# Patient Record
Sex: Male | Born: 1965
Health system: Southern US, Community
[De-identification: ages and names within clinical notes are randomized; demographics above are authoritative.]

## PROBLEM LIST (undated history)

## (undated) DIAGNOSIS — E785 Hyperlipidemia, unspecified: Secondary | ICD-10-CM

## (undated) HISTORY — DX: Hyperlipidemia, unspecified: E78.5

---

## 1991-01-11 HISTORY — PX: APPENDECTOMY: SHX54

## 2006-02-17 ENCOUNTER — Ambulatory Visit: Payer: Self-pay | Admitting: Family Medicine

## 2006-03-03 ENCOUNTER — Ambulatory Visit: Payer: Self-pay | Admitting: Family Medicine

## 2007-12-14 ENCOUNTER — Ambulatory Visit: Payer: Self-pay | Admitting: Family Medicine

## 2008-03-04 ENCOUNTER — Ambulatory Visit: Payer: Self-pay | Admitting: Family Medicine

## 2008-05-02 ENCOUNTER — Ambulatory Visit: Payer: Self-pay | Admitting: Family Medicine

## 2009-02-27 ENCOUNTER — Ambulatory Visit: Payer: Self-pay | Admitting: Family Medicine

## 2009-05-20 ENCOUNTER — Ambulatory Visit: Payer: Self-pay | Admitting: Family Medicine

## 2009-06-11 ENCOUNTER — Ambulatory Visit: Payer: Self-pay | Admitting: Physician Assistant

## 2009-07-08 ENCOUNTER — Ambulatory Visit: Payer: Self-pay | Admitting: Family Medicine

## 2010-02-05 ENCOUNTER — Ambulatory Visit: Admit: 2010-02-05 | Payer: Self-pay | Admitting: Family Medicine

## 2010-10-05 ENCOUNTER — Encounter: Payer: Self-pay | Admitting: Family Medicine

## 2010-10-08 ENCOUNTER — Encounter: Payer: Self-pay | Admitting: Family Medicine

## 2010-10-08 ENCOUNTER — Ambulatory Visit (INDEPENDENT_AMBULATORY_CARE_PROVIDER_SITE_OTHER): Payer: No Typology Code available for payment source | Admitting: Family Medicine

## 2010-10-08 VITALS — BP 122/80 | HR 60 | Ht 71.0 in | Wt 235.0 lb

## 2010-10-08 DIAGNOSIS — I499 Cardiac arrhythmia, unspecified: Secondary | ICD-10-CM

## 2010-10-08 DIAGNOSIS — Z0000 Encounter for general adult medical examination without abnormal findings: Secondary | ICD-10-CM

## 2010-10-08 DIAGNOSIS — E785 Hyperlipidemia, unspecified: Secondary | ICD-10-CM | POA: Insufficient documentation

## 2010-10-08 DIAGNOSIS — E669 Obesity, unspecified: Secondary | ICD-10-CM

## 2010-10-08 LAB — CBC WITH DIFFERENTIAL/PLATELET
Basophils Absolute: 0 10*3/uL (ref 0.0–0.1)
Basophils Relative: 0 % (ref 0–1)
Eosinophils Absolute: 0.1 10*3/uL (ref 0.0–0.7)
Eosinophils Relative: 4 % (ref 0–5)
HCT: 49.5 % — ABNORMAL HIGH (ref 36.0–46.0)
Hemoglobin: 17 g/dL — ABNORMAL HIGH (ref 12.0–15.0)
Lymphocytes Relative: 52 % — ABNORMAL HIGH (ref 12–46)
Lymphs Abs: 1.7 10*3/uL (ref 0.7–4.0)
MCH: 31.1 pg (ref 26.0–34.0)
MCHC: 34.3 g/dL (ref 30.0–36.0)
MCV: 90.7 fL (ref 78.0–100.0)
Monocytes Absolute: 0.3 10*3/uL (ref 0.1–1.0)
Monocytes Relative: 10 % (ref 3–12)
Neutro Abs: 1.1 10*3/uL — ABNORMAL LOW (ref 1.7–7.7)
Neutrophils Relative %: 34 % — ABNORMAL LOW (ref 43–77)
Platelets: 165 10*3/uL (ref 150–400)
RBC: 5.46 MIL/uL — ABNORMAL HIGH (ref 3.87–5.11)
RDW: 14.3 % (ref 11.5–15.5)
WBC: 3.3 10*3/uL — ABNORMAL LOW (ref 4.0–10.5)

## 2010-10-08 LAB — POCT URINALYSIS DIPSTICK
Bilirubin, UA: NEGATIVE
Blood, UA: NEGATIVE
Clarity, UA: NEGATIVE
Glucose, UA: NEGATIVE
Ketones, UA: NEGATIVE
Leukocytes, UA: NEGATIVE
Nitrite, UA: NEGATIVE
Protein, UA: NEGATIVE
Spec Grav, UA: 1.02
Urobilinogen, UA: NEGATIVE
pH, UA: 5

## 2010-10-08 NOTE — Progress Notes (Signed)
  Subjective:    Patient ID: Billy Tate, male    DOB: [birth year], 45 y.o.   MRN: [id number]  HPI He is here for a complete examination. He does have intermittent aches and pains especially in his knees. He also said intermittent low back pains. He has a history dating back to his teenage years of having an irregular rhythm. He was able to play in sports and apparently was cleared during that timeframe. He states he did have some procedures done but is not sure what. Review his record indicates previous documentation of unifocal PVCs. There is a family history of 2 relatives on his mother with a defib and pacer unit and apparently his brother also has a pacemaker.   Review of Systems  Constitutional: Negative.   HENT: Negative.   Eyes: Negative.   Respiratory: Negative.   Cardiovascular: Negative.   Gastrointestinal: Negative.   Genitourinary: Negative.   Musculoskeletal: Positive for back pain and arthralgias.  Neurological: Negative.        Objective:   Physical Exam BP 122/80  Pulse 60  Ht 5\' 11"  (1.803 m)  Wt 235 lb (106.595 kg)  BMI 32.78 kg/m2  General Appearance:    Alert, cooperative, no distress, appears stated age  Head:    Normocephalic, without obvious abnormality, atraumatic  Eyes:    PERRL, conjunctiva/corneas clear, EOM's intact, fundi    benign  Ears:    Normal TM's and external ear canals  Nose:   Nares normal, mucosa normal, no drainage or sinus   tenderness  Throat:   Lips, mucosa, and tongue normal; teeth and gums normal  Neck:   Supple, no lymphadenopathy;  thyroid:  no   enlargement/tenderness/nodules; no carotid   bruit or JVD  Back:    Spine nontender, no curvature, ROM normal, no CVA     tenderness  Lungs:     Clear to auscultation bilaterally without wheezes, rales or     ronchi; respirations unlabored  Chest Wall:    No tenderness or deformity   Heart:    Regular rate and rhythm, S1 and S2 normal, no murmur, rub   or gallop  Breast Exam:    No  chest wall tenderness, masses or gynecomastia  Abdomen:     Soft, non-tender, nondistended, normoactive bowel sounds,    no masses, no hepatosplenomegaly  Genitalia:    Normal male external genitalia without lesions.  Testicles without masses.  No inguinal hernias.  Rectal:    Normal sphincter tone, no masses or tenderness; guaiac negative stool.  Prostate smooth, no nodules, not enlarged.  Extremities:   No clubbing, cyanosis or edema  Pulses:   2+ and symmetric all extremities  Skin:   Skin color, texture, turgor normal, no rashes or lesions  Lymph nodes:   Cervical, supraclavicular, and axillary nodes normal  Neurologic:   CNII-XII intact, normal strength, sensation and gait; reflexes 2+ and symmetric throughout          Psych:   Normal mood, affect, hygiene and grooming.    EKG shows frequent unifocal PVCs       Assessment & Plan:   1. Physical exam, annual  POCT Urinalysis Dipstick, POCT Hemoccult (POC) Blood/Stool Test, CBC with Differential, Comprehensive metabolic panel, Lipid panel  2. Arrhythmia  PR ELECTROCARDIOGRAM, COMPLETE, CBC with Differential, Comprehensive metabolic panel, Lipid panel, Ambulatory referral to Cardiology  3. Hyperlipidemia LDL goal < 100  Lipid panel  4. Obesity (BMI 30-39.9)

## 2010-10-08 NOTE — Patient Instructions (Signed)
Keep working on [employer] and exercise. This should help all of your aches and pains.

## 2010-10-09 LAB — LIPID PANEL
Cholesterol: 235 mg/dL — ABNORMAL HIGH (ref 0–200)
HDL: 55 mg/dL (ref >39)
LDL Cholesterol: 166 mg/dL — ABNORMAL HIGH (ref 0–99)
Total CHOL/HDL Ratio: 4.3 Ratio
Triglycerides: 72 mg/dL (ref <150)
VLDL: 14 mg/dL (ref 0–40)

## 2010-10-09 LAB — COMPREHENSIVE METABOLIC PANEL
ALT: 14 U/L (ref 0–35)
AST: 17 U/L (ref 0–37)
Albumin: 4 g/dL (ref 3.5–5.2)
Alkaline Phosphatase: 63 U/L (ref 39–117)
BUN: 16 mg/dL (ref 6–23)
CO2: 27 mEq/L (ref 19–32)
Calcium: 9.5 mg/dL (ref 8.4–10.5)
Chloride: 104 mEq/L (ref 96–112)
Creat: 1.16 mg/dL — ABNORMAL HIGH (ref 0.50–1.10)
Glucose, Bld: 79 mg/dL (ref 70–99)
Potassium: 4.1 mEq/L (ref 3.5–5.3)
Sodium: 139 mEq/L (ref 135–145)
Total Bilirubin: 0.7 mg/dL (ref 0.3–1.2)
Total Protein: 7.8 g/dL (ref 6.0–8.3)

## 2011-04-22 ENCOUNTER — Encounter: Payer: Self-pay | Admitting: Medical

## 2011-04-22 ENCOUNTER — Ambulatory Visit (INDEPENDENT_AMBULATORY_CARE_PROVIDER_SITE_OTHER): Payer: No Typology Code available for payment source | Admitting: Medical

## 2011-04-22 VITALS — BP 122/80 | HR 60 | Temp 97.9°F | Resp 16 | Wt 234.0 lb

## 2011-04-22 DIAGNOSIS — R040 Epistaxis: Secondary | ICD-10-CM

## 2011-04-22 DIAGNOSIS — J069 Acute upper respiratory infection, unspecified: Secondary | ICD-10-CM

## 2011-04-22 MED ORDER — BENZONATATE 200 MG PO CAPS: 200.0000 mg | ORAL_CAPSULE | Freq: Two times a day (BID) | ORAL | Status: AC | PRN

## 2011-04-22 NOTE — Progress Notes (Signed)
Subjective:  Billy Tate is a 46 y.o. male who presents for cold symptoms and nosebleed.   He reports few day hx/o cold symptoms including scratchy throat, nasal congestion, sneezing, dry cough that is keeping him up at night interfering with sleep last nigh, and phlegm in the throat.  The main reason he came in is that when he sneezed this morning he had a nosebleed. He was able to stop the bleed in .  He had dried blood in the nose yesterday.  Using some Advil Cold and Sinus.  Denies sick contacts.  No other aggravating or relieving factors.  No other c/o.  Past Medical History  Diagnosis Date  . Dyslipidemia   . Hemorrhoids    ROS as noted in HPI   Objective:   Filed Vitals:   04/22/11 1555  BP: 122/80  Pulse: 60  Temp: 97.9 F (36.6 C)  Resp: 16    General appearance: Alert, WD/WN, no distress                             Skin: warm, no rash                           Head: no sinus tenderness                            Eyes: conjunctiva normal, corneas clear, PERRLA                            Ears: pearly TMs, external ear canals normal                          Nose: septum midline, turbinates swollen, with erythema and clear discharge             Mouth/throat: MMM, tongue normal, mild pharyngeal erythema                           Neck: supple, no adenopathy, no thyromegaly, nontender                          Heart: RRR, normal S1, S2, no murmurs                         Lungs: CTA bilaterally, no wheezes, rales, or rhonchi     Assessment and Plan:   Encounter Diagnoses  Name Primary?  . URI (upper respiratory infection) Yes  . Epistaxis    URI - Suggested symptomatic OTC remedies.  Nasal saline spray for congestion.  Tylenol or Ibuprofen OTC for fever and malaise.  Call/return in 2-3 days if symptoms aren't resolving.   Epistaxis - advised holding direct pressure of nose 58min+ with nosebleed, can use Vaseline in nares for moisture and prevention, avoid blowing nose  after initial bleed, and if he continues to have nosebleeds, or if nosebleeds >1hr without resolution, call/return, or go to the ED.

## 2012-12-21 ENCOUNTER — Ambulatory Visit (INDEPENDENT_AMBULATORY_CARE_PROVIDER_SITE_OTHER): Payer: PRIVATE HEALTH INSURANCE | Admitting: Family Medicine

## 2012-12-21 ENCOUNTER — Encounter: Payer: Self-pay | Admitting: Family Medicine

## 2012-12-21 VITALS — BP 120/82 | HR 89 | Ht 71.0 in | Wt 230.0 lb

## 2012-12-21 DIAGNOSIS — Z23 Encounter for immunization: Secondary | ICD-10-CM

## 2012-12-21 DIAGNOSIS — I4949 Other premature depolarization: Secondary | ICD-10-CM

## 2012-12-21 DIAGNOSIS — E669 Obesity, unspecified: Secondary | ICD-10-CM

## 2012-12-21 DIAGNOSIS — I493 Ventricular premature depolarization: Secondary | ICD-10-CM | POA: Insufficient documentation

## 2012-12-21 DIAGNOSIS — Z0000 Encounter for general adult medical examination without abnormal findings: Secondary | ICD-10-CM

## 2012-12-21 LAB — POCT URINALYSIS DIPSTICK
Bilirubin, UA: NEGATIVE
Blood, UA: NEGATIVE
Glucose, UA: NEGATIVE
Ketones, UA: NEGATIVE
Leukocytes, UA: NEGATIVE
Nitrite, UA: NEGATIVE
Protein, UA: NEGATIVE
Spec Grav, UA: 1.01
Urobilinogen, UA: NEGATIVE
pH, UA: 5

## 2012-12-21 LAB — COMPREHENSIVE METABOLIC PANEL
ALT: 19 U/L (ref 0–53)
AST: 18 U/L (ref 0–37)
Albumin: 3.7 g/dL (ref 3.5–5.2)
Alkaline Phosphatase: 64 U/L (ref 39–117)
BUN: 17 mg/dL (ref 6–23)
CO2: 27 mEq/L (ref 19–32)
Calcium: 8.9 mg/dL (ref 8.4–10.5)
Chloride: 105 mEq/L (ref 96–112)
Creat: 1.05 mg/dL (ref 0.50–1.35)
Glucose, Bld: 93 mg/dL (ref 70–99)
Potassium: 3.9 mEq/L (ref 3.5–5.3)
Sodium: 139 mEq/L (ref 135–145)
Total Bilirubin: 0.6 mg/dL (ref 0.3–1.2)
Total Protein: 6.8 g/dL (ref 6.0–8.3)

## 2012-12-21 LAB — CBC WITH DIFFERENTIAL/PLATELET
Basophils Absolute: 0 10*3/uL (ref 0.0–0.1)
Basophils Relative: 1 % (ref 0–1)
Eosinophils Absolute: 0.1 10*3/uL (ref 0.0–0.7)
Eosinophils Relative: 4 % (ref 0–5)
HCT: 45.2 % (ref 39.0–52.0)
Hemoglobin: 15.8 g/dL (ref 13.0–17.0)
Lymphocytes Relative: 47 % — ABNORMAL HIGH (ref 12–46)
Lymphs Abs: 1.8 10*3/uL (ref 0.7–4.0)
MCH: 31.3 pg (ref 26.0–34.0)
MCHC: 35 g/dL (ref 30.0–36.0)
MCV: 89.7 fL (ref 78.0–100.0)
Monocytes Absolute: 0.4 10*3/uL (ref 0.1–1.0)
Monocytes Relative: 12 % (ref 3–12)
Neutro Abs: 1.4 10*3/uL — ABNORMAL LOW (ref 1.7–7.7)
Neutrophils Relative %: 36 % — ABNORMAL LOW (ref 43–77)
Platelets: 186 10*3/uL (ref 150–400)
RBC: 5.04 MIL/uL (ref 4.22–5.81)
RDW: 14.7 % (ref 11.5–15.5)
WBC: 3.7 10*3/uL — ABNORMAL LOW (ref 4.0–10.5)

## 2012-12-21 LAB — LIPID PANEL
Cholesterol: 217 mg/dL — ABNORMAL HIGH (ref 0–200)
HDL: 55 mg/dL (ref >39)
LDL Cholesterol: 149 mg/dL — ABNORMAL HIGH (ref 0–99)
Total CHOL/HDL Ratio: 3.9 Ratio
Triglycerides: 66 mg/dL (ref <150)
VLDL: 13 mg/dL (ref 0–40)

## 2012-12-21 NOTE — Progress Notes (Signed)
Teaching Physician: Sharlot Gowda, MD Dictated By: Judithann Graves  Subjective:  Billy Tate is a 47 y.o. male who presents for his annual complete exam. Ongoing, intermittent (several times per week) right-sided shoulder pain over the last 3-4 months. Not felt continuously through the day. 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 Worsens with use. It is not worsening but it is not improving. No other joint involvement. Never had shoulder pain like this before. Takes Aleve daily for the knee. Hasn't tried any therapy for his shoulder. Unable to sleep on Right side because of his shoulder pain.   Has history of PVCs and last saw his cardiologist 2 years ago, has upcoming visit for routine follow-up next year. Has history of hyperlipidemia - diet rich in vegetables now, and exercises by walking 3-4 days per week. Has lost 4 pounds since his last visit here last year.   ROS negative except as in subjective.  Objective: Filed Vitals:   12/21/12 1343  BP: 120/82  Pulse: 89    Physical Exam:  BP 120/82  Pulse 89  Ht 5\' 11"  (1.803 m)  Wt 230 lb (104.327 kg)  BMI 32.09 kg/m2  SpO2 98%  General Appearance:    Alert, cooperative, no distress, appears stated age  Head:    Normocephalic, without obvious abnormality, atraumatic  Eyes:    PERRL, conjunctiva/corneas clear, EOM's intact, fundi    benign  Ears:    Normal TM's and external ear canals  Nose:   Nares normal, mucosa normal, no drainage or sinus   tenderness  Throat:   Lips, mucosa, and tongue normal; teeth and gums normal  Neck:   Supple, no lymphadenopathy;  thyroid:  no   enlargement/tenderness/nodules; no carotid   bruit or JVD  Back:    Spine nontender, no curvature, ROM normal, no CVA     tenderness  Lungs:     Clear to auscultation bilaterally without wheezes, rales or     ronchi; respirations unlabored  Chest Wall:    No tenderness or deformity   Heart:    Regular rate and rhythm,  S1 and S2 normal, no murmur, rub   or gallop  Breast Exam:    No chest wall tenderness, masses or gynecomastia  Abdomen:     Soft, non-tender, nondistended, normoactive bowel sounds,    no masses, no hepatosplenomegaly  Rectal:    Normal sphincter tone, no masses or tenderness; guaiac negative stool.  Prostate smooth, no nodules, not enlarged.  Extremities:   No clubbing, cyanosis or edema  Pulses:   2+ and symmetric all extremities  Skin:   Skin color, texture, turgor normal, no rashes or lesions  Lymph nodes:   Cervical, supraclavicular, and axillary nodes normal  MSK:   Right shoulder with FROM, strength 5/5 flexion, abduction. No apprehension, negative Neer, Hawkins, Cross-arm, Drop-arm tests.  Neurologic:   CNII-XII intact, normal strength, sensation and gait; reflexes 2+ and symmetric throughout          Psych:   Normal mood, affect, hygiene and grooming.     Assessment and Plan: 1. Routine general medical examination at a health care facility - POCT Urinalysis Dipstick - Tdap vaccine greater than or equal to 7yo IM - CBC with Differential - Comprehensive metabolic panel - Lipid panel  2. PVC's (premature ventricular contractions) These were evaluated two years ago by his cardiologist and determined to be benign. He is not presently symptomatic.  3. Obesity (  BMI 30.0-34.9) Has lost 4 pounds since his exam last year. Eager to exercise, eat appropriate portions to achieve waist size of 36 (currently at 40).  4. Right shoulder pain: Exam reassuring for FROM, fully intact strength. Will pursue physical therapy as first step should his shoulder pain become more frequent.   Dr. Susann Givens was present for the encounter and agrees with the above assessment and plan.

## 2014-01-31 ENCOUNTER — Ambulatory Visit: Payer: PRIVATE HEALTH INSURANCE | Admitting: Family Medicine

## 2014-02-07 ENCOUNTER — Ambulatory Visit (INDEPENDENT_AMBULATORY_CARE_PROVIDER_SITE_OTHER): Payer: PRIVATE HEALTH INSURANCE | Admitting: Medical

## 2014-02-07 ENCOUNTER — Encounter: Payer: Self-pay | Admitting: Medical

## 2014-02-07 VITALS — BP 122/64 | HR 60 | Temp 97.3°F | Resp 16 | Wt 229.0 lb

## 2014-02-07 DIAGNOSIS — M25561 Pain in right knee: Secondary | ICD-10-CM

## 2014-02-07 DIAGNOSIS — M79604 Pain in right leg: Secondary | ICD-10-CM

## 2014-02-07 NOTE — Progress Notes (Signed)
Subjective Here for 2 issues with the right leg.  He notes the last 2-3 weeks he has felt a knot on his right shin medial side approximate 6 inches below the knee. He first noticed this when putting lotion on his leg and it was slightly tender to touch. 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  The area was never red or hot no drainage no wound.  He says it was a little swollen at first more than now but is just a mildly tender lump at this point. He denies fevers, chills, weight loss, night sweats. No personal or family history of bone cancer.   He also notes problems with both knees for years, has had injections in the left knee, lately the right knee stays a little swollen from time to time. No recent trauma or injury. No numbness or tingling.  Review of systems as in subjective  Objective: BP 122/64 mmHg  Pulse 60  Temp(Src) 97.3 F (36.3 C) (Oral)  Resp 16  Wt 229 lb (103.874 kg)  Gen: wd, wn, nad Skin: left lateral lower leg with long patch of rough brown skin unchanged from childhood accident sliding into base on baseball field, right leg are some scattered brownish flat area from likely recent healing bruises Small 1 cm nodule of right lower leg, anteromedially proximal 1/3 of let along tibia.  Dense knot.   Hard to discern if bone versus hematoma or other dense tissue but it seems superficial.  No frank varicosities or findings to suggest thrombophlebitis. Certainly no signs of DVT, no asymmetry no swelling either leg.  Right knee nontender no laxity normal range of motion no deformity. Rest of lower extremities unremarkable Pulses normal, legs normal strength sensation and DTRs   Assessment: Encounter Diagnoses  Name Primary?  . Leg pain, right Yes  . Right knee pain     Plan: Leg pain, knot of the right anterior medial proximal third of lower leg likely small hematoma from a bruise.  Advised  heat, ice, elevation, can use NSAID.  If not seeing some improvement in the next week then he will go for x-ray. He will call me back about this  Knee exam was actually normal today. However offered x-ray given the report of swelling and pain from time to time, he declines today.  advised routine exercise.  When pain or swelling use ice and rest and elevation.  F/u within 10-14 days

## 2014-10-10 ENCOUNTER — Encounter: Payer: Self-pay | Admitting: Family Medicine

## 2014-10-10 ENCOUNTER — Ambulatory Visit (INDEPENDENT_AMBULATORY_CARE_PROVIDER_SITE_OTHER): Payer: PRIVATE HEALTH INSURANCE | Admitting: Family Medicine

## 2014-10-10 VITALS — BP 122/84 | HR 64 | Ht 71.5 in | Wt 214.0 lb

## 2014-10-10 DIAGNOSIS — E785 Hyperlipidemia, unspecified: Secondary | ICD-10-CM

## 2014-10-10 DIAGNOSIS — I493 Ventricular premature depolarization: Secondary | ICD-10-CM | POA: Diagnosis not present

## 2014-10-10 DIAGNOSIS — Z0000 Encounter for general adult medical examination without abnormal findings: Secondary | ICD-10-CM

## 2014-10-10 LAB — COMPREHENSIVE METABOLIC PANEL
ALT: 14 U/L (ref 9–46)
AST: 19 U/L (ref 10–40)
Albumin: 3.7 g/dL (ref 3.6–5.1)
Alkaline Phosphatase: 64 U/L (ref 40–115)
BUN: 14 mg/dL (ref 7–25)
CO2: 23 mmol/L (ref 20–31)
Calcium: 8.9 mg/dL (ref 8.6–10.3)
Chloride: 108 mmol/L (ref 98–110)
Creat: 1.01 mg/dL (ref 0.60–1.35)
Glucose, Bld: 93 mg/dL (ref 65–99)
Potassium: 3.8 mmol/L (ref 3.5–5.3)
Sodium: 140 mmol/L (ref 135–146)
Total Bilirubin: 0.8 mg/dL (ref 0.2–1.2)
Total Protein: 6.7 g/dL (ref 6.1–8.1)

## 2014-10-10 LAB — CBC WITH DIFFERENTIAL/PLATELET
Basophils Absolute: 0 10*3/uL (ref 0.0–0.1)
Basophils Relative: 1 % (ref 0–1)
Eosinophils Absolute: 0.1 10*3/uL (ref 0.0–0.7)
Eosinophils Relative: 4 % (ref 0–5)
HCT: 44.9 % (ref 39.0–52.0)
Hemoglobin: 15.8 g/dL (ref 13.0–17.0)
Lymphocytes Relative: 46 % (ref 12–46)
Lymphs Abs: 1.2 10*3/uL (ref 0.7–4.0)
MCH: 31.3 pg (ref 26.0–34.0)
MCHC: 35.2 g/dL (ref 30.0–36.0)
MCV: 89.1 fL (ref 78.0–100.0)
MPV: 9.2 fL (ref 8.6–12.4)
Monocytes Absolute: 0.2 10*3/uL (ref 0.1–1.0)
Monocytes Relative: 9 % (ref 3–12)
Neutro Abs: 1 10*3/uL — ABNORMAL LOW (ref 1.7–7.7)
Neutrophils Relative %: 40 % — ABNORMAL LOW (ref 43–77)
Platelets: 180 10*3/uL (ref 150–400)
RBC: 5.04 MIL/uL (ref 4.22–5.81)
RDW: 14.4 % (ref 11.5–15.5)
WBC: 2.6 10*3/uL — ABNORMAL LOW (ref 4.0–10.5)

## 2014-10-10 LAB — POCT URINALYSIS DIPSTICK
Bilirubin, UA: NEGATIVE
Blood, UA: NEGATIVE
Glucose, UA: NEGATIVE
Ketones, UA: NEGATIVE
Leukocytes, UA: NEGATIVE
Nitrite, UA: NEGATIVE
Protein, UA: NEGATIVE
Spec Grav, UA: 1.025
Urobilinogen, UA: NEGATIVE
pH, UA: 6

## 2014-10-10 LAB — LIPID PANEL
Cholesterol: 204 mg/dL — ABNORMAL HIGH (ref 125–200)
HDL: 65 mg/dL (ref >=40)
LDL Cholesterol: 128 mg/dL (ref <130)
Total CHOL/HDL Ratio: 3.1 Ratio (ref <=5.0)
Triglycerides: 54 mg/dL (ref <150)
VLDL: 11 mg/dL (ref <30)

## 2014-10-10 NOTE — Progress Notes (Signed)
   Subjective:    Patient ID: Billy Tate, male    DOB: April 22, 1965, 49 y.o.   MRN: [id number]  HPI He is here for a complete examination. He does have a previous history of PVCs presently is having no symptoms from this. He has made some changes in his eating habits and has lost some weight recently. Does state that there is a family history of several people having uterine defib unit or pacer. He also has a history of hyperlipidemia. He has no other concerns or complaints. No history of chest pain, shortness of breath, abdominal pain or urologic problems.   Review of Systems  All other systems reviewed and are negative.      Objective:   Physical Exam BP 122/84 mmHg  Pulse 64  Ht 5' 11.5" (1.816 m)  Wt 214 lb (97.07 kg)  BMI 29.43 kg/m2  SpO2 99%  General Appearance:    Alert, cooperative, no distress, appears stated age  Head:    Normocephalic, without obvious abnormality, atraumatic  Eyes:    PERRL, conjunctiva/corneas clear, EOM's intact, fundi    benign  Ears:    Normal TM's and external ear canals  Nose:   Nares normal, mucosa normal, no drainage or sinus   tenderness  Throat:   Lips, mucosa, and tongue normal; teeth and gums normal  Neck:   Supple, no lymphadenopathy;  thyroid:  no   enlargement/tenderness/nodules; no carotid   bruit or JVD  Back:    Spine nontender, no curvature, ROM normal, no CVA     tenderness  Lungs:     Clear to auscultation bilaterally without wheezes, rales or     ronchi; respirations unlabored  Chest Wall:    No tenderness or deformity   Heart:    Regular rate and rhythm, S1 and S2 normal, no murmur, rub   or gallop  Breast Exam:    No chest wall tenderness, masses or gynecomastia  Abdomen:     Soft, non-tender, nondistended, normoactive bowel sounds,    no masses, no hepatosplenomegaly        Extremities:   No clubbing, cyanosis or edema  Pulses:   2+ and symmetric all extremities  Skin:   Skin color, texture, turgor normal, no rashes or  lesions  Lymph nodes:   Cervical, supraclavicular, and axillary nodes normal  Neurologic:   CNII-XII intact, normal strength, sensation and gait; reflexes 2+ and symmetric throughout          Psych:   Normal mood, affect, hygiene and grooming.    Previous EKG was reviewed.      Assessment & Plan:  Routine general medical examination at a health care facility - Plan: POCT Urinalysis Dipstick, CBC with Differential/Platelet, Comprehensive metabolic panel, Lipid panel  PVC's (premature ventricular contractions) - Plan: CBC with Differential/Platelet, Comprehensive metabolic panel  Hyperlipidemia with target LDL less than 100 - Plan: Lipid panel  encouraged him to continue with his weight loss program. Suggested that he get down to roughly a size 36 waist. He plans to get his flu shot at work.

## 2014-10-11 NOTE — Addendum Note (Signed)
Addended by: Ronnald Nian on: 10/11/2014 09:19 PM   Modules accepted: Orders

## 2015-06-05 ENCOUNTER — Other Ambulatory Visit: Payer: Self-pay | Admitting: Medical

## 2015-06-05 ENCOUNTER — Telehealth: Payer: Self-pay | Admitting: Family Medicine

## 2015-06-05 MED ORDER — SCOPOLAMINE 1 MG/3DAYS TD PT72: 1.0000 | MEDICATED_PATCH | TRANSDERMAL | Status: AC

## 2015-06-05 NOTE — Telephone Encounter (Signed)
Scopolamine patch sent

## 2015-06-05 NOTE — Telephone Encounter (Signed)
Pt's wife called and stated that yesterday [facility] sent her in medication for a cruse she and her husband are leaving for tomorrow. She forgot to have some sent in for her husband as well. She is requesting something for possible sea sickness be sent in for him as well. Pt uses [facility] and she can be reached at [phone]. Sending back to [place] and Vincenza HewsShane as Rosaura CarpenterJCL is out of office and needs to be taken care of today.

## 2016-06-10 ENCOUNTER — Emergency Department (HOSPITAL_COMMUNITY): Payer: No Typology Code available for payment source

## 2016-06-10 ENCOUNTER — Encounter (HOSPITAL_COMMUNITY): Payer: Self-pay

## 2016-06-10 DIAGNOSIS — Y92019 Unspecified place in single-family (private) house as the place of occurrence of the external cause: Secondary | ICD-10-CM | POA: Insufficient documentation

## 2016-06-10 DIAGNOSIS — Y999 Unspecified external cause status: Secondary | ICD-10-CM | POA: Insufficient documentation

## 2016-06-10 DIAGNOSIS — R001 Bradycardia, unspecified: Secondary | ICD-10-CM | POA: Diagnosis not present

## 2016-06-10 DIAGNOSIS — W2209XA Striking against other stationary object, initial encounter: Secondary | ICD-10-CM | POA: Diagnosis not present

## 2016-06-10 DIAGNOSIS — S060X0A Concussion without loss of consciousness, initial encounter: Secondary | ICD-10-CM | POA: Diagnosis not present

## 2016-06-10 DIAGNOSIS — Y93E5 Activity, floor mopping and cleaning: Secondary | ICD-10-CM | POA: Insufficient documentation

## 2016-06-10 DIAGNOSIS — S0990XA Unspecified injury of head, initial encounter: Secondary | ICD-10-CM | POA: Diagnosis present

## 2016-06-10 LAB — BASIC METABOLIC PANEL
Anion gap: 10 (ref 5–15)
BUN: 10 mg/dL (ref 6–20)
CO2: 25 mmol/L (ref 22–32)
Calcium: 9.1 mg/dL (ref 8.9–10.3)
Chloride: 101 mmol/L (ref 101–111)
Creatinine, Ser: 1.05 mg/dL (ref 0.61–1.24)
GFR calc Af Amer: >60 mL/min (ref >60)
GFR calc non Af Amer: >60 mL/min (ref >60)
Glucose, Bld: 97 mg/dL (ref 65–99)
Potassium: 3.9 mmol/L (ref 3.5–5.1)
Sodium: 136 mmol/L (ref 135–145)

## 2016-06-10 LAB — CBC
HCT: 48.1 % (ref 39.0–52.0)
Hemoglobin: 17.8 g/dL — ABNORMAL HIGH (ref 13.0–17.0)
MCH: 32.7 pg (ref 26.0–34.0)
MCHC: 37 g/dL — ABNORMAL HIGH (ref 30.0–36.0)
MCV: 88.4 fL (ref 78.0–100.0)
Platelets: 163 10*3/uL (ref 150–400)
RBC: 5.44 MIL/uL (ref 4.22–5.81)
RDW: 13.4 % (ref 11.5–15.5)
WBC: 5.6 10*3/uL (ref 4.0–10.5)

## 2016-06-10 LAB — I-STAT TROPONIN, ED: Troponin i, poc: 0 ng/mL (ref 0.00–0.08)

## 2016-06-10 LAB — MAGNESIUM: Magnesium: 1.7 mg/dL (ref 1.7–2.4)

## 2016-06-10 NOTE — ED Triage Notes (Signed)
Pt complaining of lightheaded and dizziness. Pt states stood up and struck back of head on countertop. Pt brady at triage, HR = 34bpm. Pt complaining of generalized weakness. Pt denies any chest pain or SOB. Pt a/o x 4 at triage. Pt states emesis x 3 today post injury. Pt denies any blood thinners.

## 2016-06-11 ENCOUNTER — Emergency Department (HOSPITAL_COMMUNITY)
Admission: EM | Admit: 2016-06-11 | Discharge: 2016-06-11 | Disposition: A | Discharge status: Home / Self Care | Payer: No Typology Code available for payment source | Attending: Emergency Medicine | Admitting: Emergency Medicine

## 2016-06-11 DIAGNOSIS — S060X0A Concussion without loss of consciousness, initial encounter: Secondary | ICD-10-CM

## 2016-06-11 DIAGNOSIS — I493 Ventricular premature depolarization: Secondary | ICD-10-CM

## 2016-06-11 MED ORDER — METOCLOPRAMIDE HCL 5 MG/ML IJ SOLN
10.0000 mg | Freq: Once | INTRAMUSCULAR | Status: AC
  Administered 2016-06-11: 10 mg via INTRAVENOUS
  Filled 2016-06-11: qty 2

## 2016-06-11 MED ORDER — MECLIZINE HCL 25 MG PO TABS
25.0000 mg | ORAL_TABLET | Freq: Once | ORAL | Status: AC
  Administered 2016-06-11: 25 mg via ORAL
  Filled 2016-06-11: qty 1

## 2016-06-11 MED ORDER — DIPHENHYDRAMINE HCL 50 MG/ML IJ SOLN
12.5000 mg | Freq: Once | INTRAMUSCULAR | Status: AC
  Administered 2016-06-11: 12.5 mg via INTRAVENOUS
  Filled 2016-06-11: qty 1

## 2016-06-11 MED ORDER — MECLIZINE HCL 25 MG PO TABS: 25.0000 mg | ORAL_TABLET | Freq: Three times a day (TID) | ORAL | 0 refills | Status: AC | PRN

## 2016-06-11 MED ORDER — METOPROLOL TARTRATE 25 MG PO TABS
12.5000 mg | ORAL_TABLET | Freq: Once | ORAL | Status: AC
  Administered 2016-06-11: 12.5 mg via ORAL
  Filled 2016-06-11: qty 1

## 2016-06-11 MED ORDER — SODIUM CHLORIDE 0.9 % IV BOLUS (SEPSIS)
1000.0000 mL | Freq: Once | INTRAVENOUS | Status: AC
  Administered 2016-06-11: 1000 mL via INTRAVENOUS

## 2016-06-11 NOTE — ED Notes (Signed)
ED Provider at bedside. 

## 2016-06-11 NOTE — ED Notes (Signed)
Explained delay, informed that room was assigned and being cleaned.

## 2016-06-11 NOTE — ED Provider Notes (Signed)
[facility]-EMERGENCY DEPT Provider Note   CSN: [id number] Arrival date & time: 06/10/16  2206   By signing my name below, I, Billy Tate, attest that this documentation has been prepared under the direction and in the presence of Charlynne Pander, MD. Electronically Signed: Soijett Tate, ED Scribe. 06/11/16. 2:07 AM.  History   Chief Complaint Chief Complaint  Patient presents with  . Head Injury  . Bradycardia    HPI Billy Tate is a 51 y.o. male who presents to the Emergency Department complaining of head injury occurring PTA. Pt reports associated dizziness, HA, and nausea. Pt has not tried any medications for the relief of his symptoms. He notes that he was cleaning stuff off the floor when he abruptly stood up and accidentally stuck his posterior head on the countertop. Pt states that in regards to the slow HR found while in the ED that he has experienced palpitations in the past, but hasn't been evaluated for it. He denies LOC, palpitations, vomiting, and any other symptoms. Denies hx of cardiac issues.       The history is provided by the patient. No language interpreter was used.    Past Medical History:  Diagnosis Date  . Dyslipidemia   . Hemorrhoids     Patient Active Problem List   Diagnosis Date Noted  . PVC's (premature ventricular contractions) 12/21/2012  . Hyperlipidemia with target LDL less than 100 10/08/2010    Past Surgical History:  Procedure Laterality Date  . APPENDECTOMY  1993       Home Medications    Prior to Admission medications   Medication Sig Start Date End Date Taking? Authorizing Provider  Multiple Vitamins-Minerals (MULTIVITAMIN WITH MINERALS) tablet Take 1 tablet by mouth daily.      [provider]  naproxen sodium (ANAPROX) 220 MG tablet Take 220 mg by mouth 2 (two) times daily with a meal.      [provider]  scopolamine (TRANSDERM-SCOP, 1.5 MG,) 1 MG/3DAYS Place 1 patch (1.5 mg total) onto the skin every 3  (three) days. 06/05/15   Tysinger, Kermit Balo, PA-C    Family History History reviewed. No pertinent family history.  Social History Social History  Substance Use Topics  . Smoking status: Never Smoker  . Smokeless tobacco: Never Used  . Alcohol use No     Allergies   Patient has no known allergies.   Review of Systems Review of Systems  Cardiovascular: Negative for palpitations.  Gastrointestinal: Positive for nausea. Negative for vomiting.  Neurological: Positive for dizziness and headaches. Negative for syncope.  All other systems reviewed and are negative.    Physical Exam Updated Vital Signs BP (!) 136/93   Pulse 72   Temp 98.9 F (37.2 C) (Oral)   Resp 14   SpO2 100%   Physical Exam  Constitutional: He is oriented to person, place, and time. He appears well-developed and well-nourished. No distress.  HENT:  Head: Normocephalic and atraumatic.  Right Ear: Tympanic membrane, external ear and ear canal normal. No hemotympanum.  Left Ear: Tympanic membrane, external ear and ear canal normal. No hemotympanum.  No scalp hematoma.   Eyes: EOM are normal. Right eye exhibits no nystagmus. Left eye exhibits no nystagmus.  Neck: Neck supple.  Cardiovascular: Normal rate, regular rhythm and normal heart sounds.  Exam reveals no gallop and no friction rub.   No murmur heard. Pulmonary/Chest: Effort normal and breath sounds normal. No respiratory distress. He has no wheezes. He has no rales.  Abdominal: Soft. He exhibits no distension. There is no tenderness.  Musculoskeletal: Normal range of motion.       Cervical back: Normal.  No midline cervical tenderness.   Neurological: He is alert and oriented to person, place, and time.  Skin: Skin is warm and dry.  Psychiatric: He has a normal mood and affect. His behavior is normal.  Nursing note and vitals reviewed.    ED Treatments / Results  DIAGNOSTIC STUDIES: Oxygen Saturation is 100% on RA, nl by my interpretation.      COORDINATION OF CARE: 2:07 AM Discussed treatment plan with pt at bedside and pt agreed to plan.   Labs (all labs ordered are listed, but only abnormal results are displayed) Labs Reviewed  CBC - Abnormal; Notable for the following:       Result Value   Hemoglobin 17.8 (*)    MCHC 37.0 (*)    All other components within normal limits  BASIC METABOLIC PANEL  MAGNESIUM  I-STAT TROPOININ, ED    EKG  EKG Interpretation None       Radiology Dg Chest 2 View  Result Date: 06/10/2016 CLINICAL DATA:  Lightheadedness and dizziness EXAM: CHEST  2 VIEW COMPARISON:  None. FINDINGS: No consolidation or effusion. Borderline cardiomegaly. No pneumothorax. IMPRESSION: Borderline cardiomegaly.  No acute infiltrate or edema. Electronically Signed   By: Jasmine Pang M.D.   On: 06/10/2016 22:49   Ct Head Wo Contrast  Result Date: 06/10/2016 CLINICAL DATA:  Acute onset of lightheadedness, dizziness, generalized weakness and vomiting. Struck back of head on countertop. Initial encounter. EXAM: CT HEAD WITHOUT CONTRAST TECHNIQUE: Contiguous axial images were obtained from the base of the skull through the vertex without intravenous contrast. COMPARISON:  None. FINDINGS: Brain: No evidence of acute infarction, hemorrhage, hydrocephalus, extra-axial collection or mass lesion/mass effect. The posterior fossa, including the cerebellum, brainstem and fourth ventricle, is within normal limits. The third and lateral ventricles, and basal ganglia are unremarkable in appearance. The cerebral hemispheres are symmetric in appearance, with normal gray-white differentiation. No mass effect or midline shift is seen. Vascular: No hyperdense vessel or unexpected calcification. Skull: There is no evidence of fracture; visualized osseous structures are unremarkable in appearance. Sinuses/Orbits: The orbits are within normal limits. The paranasal sinuses and mastoid air cells are well-aerated. Other: No significant soft  tissue abnormalities are seen. IMPRESSION: No evidence of traumatic intracranial injury or fracture. Electronically Signed   By: Roanna Raider M.D.   On: 06/10/2016 23:34    Procedures Procedures (including critical care time)  Medications Ordered in ED Medications - No data to display   Initial Impression / Assessment and Plan / ED Course  I have reviewed the triage vital signs and the nursing notes.  Pertinent labs & imaging results that were available during my care of the patient were reviewed by me and considered in my medical decision making (see chart for details).     Billy Tate is a 51 y.o. male here with dizziness s/p head injury. Likely concussion. Will get labs, CT head. Patient was noted to be bradycardic to 30-40s in triage. He has hx of premature atrial beats, on the monitor, he has occassional PVCs and some bigeminy. He has no chest pain or SOB, HR in the 60s on the monitor. Will give meclizine, migraine cocktail and reassess.  4:48 AM CT head unremarkable. Labs unremarkable. Felt better with migraine cocktail, meclizine. Able to stand up and walk. Has mild L horizontal nystagmus now but felt  better. I think likely post concussive syndrome. Will dc home with meclizine prn, motrin, tylenol.    Final Clinical Impressions(s) / ED Diagnoses   Final diagnoses:  None    New Prescriptions New Prescriptions   No medications on file   I personally performed the services described in this documentation, which was scribed in my presence. The recorded information has been reviewed and is accurate.    Charlynne PanderYao, David Hsienta, MD 06/11/16 [phone]

## 2016-06-11 NOTE — Discharge Instructions (Signed)
Expect headache and dizziness and unsteadiness for 2-3 days.   Rest for 2-3 days.   Take tylenol, motrin for headaches.   Take meclizine for dizziness.   You have some premature beats and you can see your doctor for that.   Return to ER if you have worse headaches, vomiting, dizziness, passing out.

## 2016-06-13 ENCOUNTER — Encounter: Payer: Self-pay | Admitting: Family Medicine

## 2016-06-13 ENCOUNTER — Ambulatory Visit (INDEPENDENT_AMBULATORY_CARE_PROVIDER_SITE_OTHER): Payer: PRIVATE HEALTH INSURANCE | Admitting: Family Medicine

## 2016-06-13 VITALS — BP 130/80 | HR 86 | Resp 16 | Wt 227.2 lb

## 2016-06-13 DIAGNOSIS — G44309 Post-traumatic headache, unspecified, not intractable: Secondary | ICD-10-CM

## 2016-06-13 DIAGNOSIS — R42 Dizziness and giddiness: Secondary | ICD-10-CM | POA: Diagnosis not present

## 2016-06-13 NOTE — Progress Notes (Signed)
   Subjective:    Patient ID: Billy Tate, male    DOB: [birth year], 51 y.o.   MRN: [id number]  HPI On June 2 he was cleaning the floor and stood up hitting his head on a countertop. He did experience pain with no loss of consciousness but did develop some blurred vision and dizziness and nausea as well as several episodes of vomiting. He was then seen in the emergency room. A CT scan and blood work was done which was negative. He was treated symptomatically and did respond fairly well to this. He now complains of intermittent headache and dizziness. He states that he is roughly 40-50% better. He has been using Tylenol as well as meclizine.   Review of Systems     Objective:   Physical Exam Alert and in no distress. EOMI. Other cranial nerves grossly intact. Normal cerebellar and DTRs. Tympanic membranes and canals are normal. Pharyngeal area is normal. Neck is supple without adenopathy or thyromegaly. Cardiac exam shows a regular sinus rhythm without murmurs or gallops. Lungs are clear to auscultation.        Assessment & Plan:  Post-traumatic headache, not intractable, unspecified chronicity pattern  Dizziness Recommend he use 12.5 mg of meclizine as needed for the dizziness as well as 2 Tylenol 4 times per day as needed and regular dosing of 2 Aleve twice per day. I will give him a note for the next 2 days. Discussed the fact that the symptoms should slowly go away however if he is still having some difficulty , We can reevaluate and possibly use other meds for his headache.

## 2016-06-13 NOTE — Patient Instructions (Signed)
Take 2 Aleve twice per day for the headache regularly and the rest of the week. Use the meclizine as needed for the dizziness

## 2016-06-14 ENCOUNTER — Inpatient Hospital Stay: Payer: PRIVATE HEALTH INSURANCE | Admitting: Family Medicine

## 2016-06-15 ENCOUNTER — Telehealth: Payer: Self-pay

## 2016-06-15 NOTE — Telephone Encounter (Signed)
Pt is still having dizziness and headaches. She reports that pt needs note to be out of work tomorrow. Is this ok? CB # [id number].

## 2016-06-16 NOTE — Telephone Encounter (Signed)
This was to go to you

## 2016-06-16 NOTE — Telephone Encounter (Signed)
Okay for note

## 2016-06-16 NOTE — Telephone Encounter (Signed)
Letter written and placed at front desk for pick up. Billy Tate/RLB

## 2016-06-16 NOTE — Telephone Encounter (Signed)
LM to inform pt of note.

## 2017-10-13 ENCOUNTER — Encounter: Payer: Self-pay | Admitting: Family Medicine

## 2017-10-13 ENCOUNTER — Ambulatory Visit: Payer: PRIVATE HEALTH INSURANCE | Admitting: Family Medicine

## 2017-10-13 VITALS — BP 130/86 | HR 63 | Temp 97.7°F | Ht 71.0 in | Wt 226.8 lb

## 2017-10-13 DIAGNOSIS — E669 Obesity, unspecified: Secondary | ICD-10-CM | POA: Diagnosis not present

## 2017-10-13 DIAGNOSIS — Z0000 Encounter for general adult medical examination without abnormal findings: Secondary | ICD-10-CM | POA: Diagnosis not present

## 2017-10-13 DIAGNOSIS — Z1211 Encounter for screening for malignant neoplasm of colon: Secondary | ICD-10-CM

## 2017-10-13 DIAGNOSIS — Z8249 Family history of ischemic heart disease and other diseases of the circulatory system: Secondary | ICD-10-CM | POA: Diagnosis not present

## 2017-10-13 DIAGNOSIS — E785 Hyperlipidemia, unspecified: Secondary | ICD-10-CM | POA: Diagnosis not present

## 2017-10-13 NOTE — Progress Notes (Signed)
Subjective:    Patient ID: Billy Tate, male    DOB: 12/04/1965, 52 y.o.   MRN: [id number]  HPI He is here for complete examination.  He does have a history of PVCs and has seen cardiology for this as well as a very strong family history of heart disease.  He states that he has been seen by Dr. Herbie Baltimore however I do not see the data in the chart.  Presently he is not on medications.  Is had no chest pain, shortness of breath, PND or DOE.  He does have occasional difficulty with sinus congestion and headache.  He also notes difficulty when he flies.  Does have a history of hyperlipidemia.  No family history of colon cancer or colonic polyps.  His work and home life are going very well.  He does have grandchildren and enjoys them.  Family and social history as well as health maintenance and immunizations was reviewed.   Review of Systems  All other systems reviewed and are negative.      Objective:   Physical Exam BP 130/86 (BP Location: Left Arm, Patient Position: Sitting)   Pulse 63   Temp 97.7 F (36.5 C)   Ht 5\' 11"  (1.803 m)   Wt 226 lb 12.8 oz (102.9 kg)   SpO2 98%   BMI 31.63 kg/m   General Appearance:    Alert, cooperative, no distress, appears stated age  Head:    Normocephalic, without obvious abnormality, atraumatic  Eyes:    PERRL, conjunctiva/corneas clear, EOM's intact,  Ears:    Normal TM's and external ear canals  Nose:   Nares normal, mucosa normal, no drainage or sinus   tenderness  Throat:   Lips, mucosa, and tongue normal; teeth and gums normal  Neck:   Supple, no lymphadenopathy;  thyroid:  no   enlargement/tenderness/nodules; no carotid   bruit or JVD  Back:    Spine nontender, no curvature, ROM normal, no CVA     tenderness  Lungs:     Clear to auscultation bilaterally without wheezes, rales or     ronchi; respirations unlabored      Heart:    Regular rate and rhythm, S1 and S2 normal, no murmur, rub   or gallop     Abdomen:     Soft, non-tender,  nondistended, normoactive bowel sounds,    no masses, no hepatosplenomegaly  Genitalia:   Deferred  Rectal:   Deferred  Extremities:   No clubbing, cyanosis or edema  Pulses:   2+ and symmetric all extremities  Skin:   Skin color, texture, turgor normal, no rashes or lesions  Lymph nodes:   Cervical, supraclavicular, and axillary nodes normal  Neurologic:   CNII-XII intact, normal strength, sensation and gait; reflexes 2+ and symmetric throughout          Psych:   Normal mood, affect, hygiene and grooming.    EKG does show a bradycardia with 1 PVC.      Assessment & Plan:  Routine general medical examination at a health care facility - Plan: CBC with Differential/Platelet, Comprehensive metabolic panel, Lipid panel  Hyperlipidemia with target LDL less than 100 - Plan: Lipid panel  Family history of heart disease - Plan: EKG 12-Lead  Obesity (BMI 30-39.9)  Screening for colon cancer - Plan: Cologuard I discussed diet and exercise with him.  Recommended 20 minutes of something physical daily or 150 minutes a week.  He is also to continue to use his Advil Cold  and Sinus.  Also recommend using Afrin nasal spray when he flies.

## 2017-10-14 LAB — CBC WITH DIFFERENTIAL/PLATELET
Basophils Absolute: 0 10*3/uL (ref 0.0–0.2)
Basos: 1 %
EOS (ABSOLUTE): 0.1 10*3/uL (ref 0.0–0.4)
Eos: 3 %
Hematocrit: 47.6 % (ref 37.5–51.0)
Hemoglobin: 16.3 g/dL (ref 13.0–17.7)
Immature Grans (Abs): 0 10*3/uL (ref 0.0–0.1)
Immature Granulocytes: 0 %
Lymphocytes Absolute: 1.7 10*3/uL (ref 0.7–3.1)
Lymphs: 48 %
MCH: 30.7 pg (ref 26.6–33.0)
MCHC: 34.2 g/dL (ref 31.5–35.7)
MCV: 90 fL (ref 79–97)
Monocytes Absolute: 0.4 10*3/uL (ref 0.1–0.9)
Monocytes: 11 %
Neutrophils Absolute: 1.3 10*3/uL — ABNORMAL LOW (ref 1.4–7.0)
Neutrophils: 37 %
Platelets: 196 10*3/uL (ref 150–450)
RBC: 5.31 x10E6/uL (ref 4.14–5.80)
RDW: 14 % (ref 12.3–15.4)
WBC: 3.5 10*3/uL (ref 3.4–10.8)

## 2017-10-14 LAB — COMPREHENSIVE METABOLIC PANEL
ALT: 29 IU/L (ref 0–44)
AST: 23 IU/L (ref 0–40)
Albumin/Globulin Ratio: 1.4 (ref 1.2–2.2)
Albumin: 4.2 g/dL (ref 3.5–5.5)
Alkaline Phosphatase: 74 IU/L (ref 39–117)
BUN/Creatinine Ratio: 16 (ref 9–20)
BUN: 18 mg/dL (ref 6–24)
Bilirubin Total: 0.6 mg/dL (ref 0.0–1.2)
CO2: 23 mmol/L (ref 20–29)
Calcium: 9.4 mg/dL (ref 8.7–10.2)
Chloride: 102 mmol/L (ref 96–106)
Creatinine, Ser: 1.12 mg/dL (ref 0.76–1.27)
GFR calc Af Amer: 87 mL/min/{1.73_m2} (ref >59)
GFR calc non Af Amer: 75 mL/min/{1.73_m2} (ref >59)
Globulin, Total: 3 g/dL (ref 1.5–4.5)
Glucose: 87 mg/dL (ref 65–99)
Potassium: 3.8 mmol/L (ref 3.5–5.2)
Sodium: 142 mmol/L (ref 134–144)
Total Protein: 7.2 g/dL (ref 6.0–8.5)

## 2017-10-14 LAB — LIPID PANEL
Chol/HDL Ratio: 3.3 ratio (ref 0.0–5.0)
Cholesterol, Total: 230 mg/dL — ABNORMAL HIGH (ref 100–199)
HDL: 69 mg/dL (ref >39)
LDL Calculated: 149 mg/dL — ABNORMAL HIGH (ref 0–99)
Triglycerides: 61 mg/dL (ref 0–149)
VLDL Cholesterol Cal: 12 mg/dL (ref 5–40)

## 2017-10-16 ENCOUNTER — Encounter: Payer: Self-pay | Admitting: Family Medicine

## 2017-12-29 ENCOUNTER — Telehealth: Payer: Self-pay

## 2017-12-29 NOTE — Telephone Encounter (Signed)
LVM for pt to advise cologuard is requesting sample be submitted. KH

## 2018-01-01 ENCOUNTER — Encounter: Payer: Self-pay | Admitting: Family Medicine

## 2018-08-10 ENCOUNTER — Other Ambulatory Visit: Payer: Self-pay

## 2018-08-10 ENCOUNTER — Encounter: Payer: Self-pay | Admitting: Medical

## 2018-08-10 ENCOUNTER — Ambulatory Visit (INDEPENDENT_AMBULATORY_CARE_PROVIDER_SITE_OTHER): Payer: PRIVATE HEALTH INSURANCE | Admitting: Medical

## 2018-08-10 VITALS — BP 120/80 | HR 68 | Temp 98.0°F | Resp 16 | Ht 71.0 in | Wt 232.2 lb

## 2018-08-10 DIAGNOSIS — K644 Residual hemorrhoidal skin tags: Secondary | ICD-10-CM

## 2018-08-10 DIAGNOSIS — Z1211 Encounter for screening for malignant neoplasm of colon: Secondary | ICD-10-CM

## 2018-08-10 MED ORDER — HYDROCORTISONE 2.5 % EX CREA: TOPICAL_CREAM | Freq: Two times a day (BID) | CUTANEOUS | 0 refills | Status: AC

## 2018-08-10 MED ORDER — HYDROCODONE-ACETAMINOPHEN 5-325 MG PO TABS: 1.0000 | ORAL_TABLET | Freq: Four times a day (QID) | ORAL | 0 refills | Status: DC | PRN

## 2018-08-10 MED ORDER — DOCUSATE SODIUM 100 MG PO CAPS: 100.0000 mg | ORAL_CAPSULE | Freq: Two times a day (BID) | ORAL | 0 refills | Status: AC

## 2018-08-10 NOTE — Patient Instructions (Signed)
Recommendations   Over the weekend use hot soapy bath soaks 20 minutes, several times this weekend  Use the hydrocortisone cream topically to the hemorrhoid over the weekend  If you experience worse pain, you can use the Hydrocodone pain medication  Drink plenty of water throughout the day  Use the Colace stool softener short term to help ease the passage of stool  If worsening or not improving over the next several days, then recheck  Don't turn in the Cologuard until you check back with [place] given the current bleeding with the hemorrhoid.

## 2018-08-10 NOTE — Progress Notes (Signed)
Subjective:  Billy Tate is a 53 y.o. male who presents for Chief Complaint  Patient presents with  . hemmorroid    hemmorroid flare up x 4-5 days     Here for possible hemorrhoid concern.  He notes 2-day history of feeling something hanging down at the rectum with some irritation.  He also has some bright red bleeding on the tissue and when he goes to clean himself he will get some blood on the cloth.  Had some blood in the underwear.  Has some discomfort but not severe pain.  Has some itching.  He does not think he has had hemorrhoid issues in the past.  No history of blood in the stool in the past.  No fever.  No abdominal pain or back pain.  Normally has 2 bowel movements a day and that has been consistent.  No recent diet changes.  No significant bloating or gas.  He has the Cologuard kit at home but has not turned in yet.  No family history of colon cancer.  No other aggravating or relieving factors. No other complaint.  The following portions of the patient's history were reviewed and updated as appropriate: allergies, current medications, past family history, past medical history, past social history, past surgical history and problem list.  ROS Otherwise as in subjective above   Objective: BP 120/80   Pulse 68   Temp 98 F (36.7 C) (Oral)   Resp 16   Ht 5' 11" (1.803 m)   Wt 232 lb 3.2 oz (105.3 kg)   SpO2 98%   BMI 32.39 kg/m   General appearance: alert, no distress, well developed, well nourished. AA male Left-side of anus with swollen hemorrhoid approx 1.3 cm diam with slight area of recent bleeding, weeping bleeding area with slight opening, mildly tender     Assessment: Encounter Diagnoses  Name Primary?  . Inflamed external hemorrhoid Yes  . Screen for colon cancer      Plan: We discussed his symptoms and concerns and exam findings.  We discussed hot soapy bath, hydrocortisone cream topically, stool softener temporarily, can use pain medicine as needed,.   We discussed possible complications including thrombosed hemorrhoid that may require hemorrhoid procedure if this worsens.  We discussed good fiber and water intake and preventative measures, avoid heavy lifting for the time being.  He has not completed his Cologuard colon cancer screen.  However in light of the current hemorrhoid and bleeding advise he not turn it in just yet as it would give a false positive.  He will need to coordinate with Dr. Redmond School on turning a Cologuard once there is no more bleeding from the hemorrhoid.  Or may need to switch to colonoscopy if this continues to be an issue.  Patient Instructions  Recommendations   Over the weekend use hot soapy bath soaks 20 minutes, several times this weekend  Use the hydrocortisone cream topically to the hemorrhoid over the weekend  If you experience worse pain, you can use the Hydrocodone pain medication  Drink plenty of water throughout the day  Use the Colace stool softener short term to help ease the passage of stool  If worsening or not improving over the next several days, then recheck  Don't turn in the Cologuard until you check back with [place] given the current bleeding with the hemorrhoid.       Warden was seen today for hemmorroid.  Diagnoses and all orders for this visit:  Inflamed external hemorrhoid  Screen  for colon cancer  Other orders -     hydrocortisone 2.5 % cream; Apply topically 2 (two) times daily. -     HYDROcodone-acetaminophen (NORCO) 5-325 MG tablet; Take 1 tablet by mouth every 6 (six) hours as needed for moderate pain. -     docusate sodium (COLACE) 100 MG capsule; Take 1 capsule (100 mg total) by mouth 2 (two) times daily.    Follow up: with Dr. Redmond School next week

## 2018-10-24 ENCOUNTER — Telehealth: Payer: Self-pay

## 2018-10-24 DIAGNOSIS — Z1211 Encounter for screening for malignant neoplasm of colon: Secondary | ICD-10-CM

## 2018-10-24 NOTE — Telephone Encounter (Signed)
Pt cologuard order has expired pt will need new order. Billy Tate

## 2019-04-30 ENCOUNTER — Encounter: Payer: Self-pay | Admitting: Family Medicine

## 2019-11-25 ENCOUNTER — Other Ambulatory Visit: Payer: Self-pay

## 2019-11-25 ENCOUNTER — Ambulatory Visit: Payer: Commercial Managed Care - PPO | Admitting: Medical

## 2019-11-25 ENCOUNTER — Encounter: Payer: Self-pay | Admitting: Medical

## 2019-11-25 VITALS — BP 140/92 | HR 60 | Wt 232.4 lb

## 2019-11-25 DIAGNOSIS — M5441 Lumbago with sciatica, right side: Secondary | ICD-10-CM | POA: Diagnosis not present

## 2019-11-25 DIAGNOSIS — M79604 Pain in right leg: Secondary | ICD-10-CM | POA: Diagnosis not present

## 2019-11-25 DIAGNOSIS — M5431 Sciatica, right side: Secondary | ICD-10-CM

## 2019-11-25 MED ORDER — PREDNISONE 10 MG PO TABS: ORAL_TABLET | ORAL | 0 refills | Status: DC

## 2019-11-25 MED ORDER — HYDROCODONE-ACETAMINOPHEN 5-325 MG PO TABS: 1.0000 | ORAL_TABLET | Freq: Four times a day (QID) | ORAL | 0 refills | Status: DC | PRN

## 2019-11-25 NOTE — Progress Notes (Signed)
Subjective:  Billy Tate is a 54 y.o. male who presents for Chief Complaint  Patient presents with  . Back Pain    radiate down right leg     Here with wife today for back pain.  He notes about 6 to 7-day history of pain in the right low back, pain in the right buttock and anterior right leg.  Denies numbness or tingling or weakness but has pain radiating down the leg.  He has some pain worse when he stands straight.  He has an active job.  He works for [employer] and moving things.  He is always on the mood.  He works in Engineer, water.  Denies fever, denies incontinence, denies saddle anesthesia, no fall.  No recent injury or trauma or fall.  Using Aleve, ice, heat, warm baths.   No other aggravating or relieving factors.    No other c/o.  The following portions of the patient's history were reviewed and updated as appropriate: allergies, current medications, past family history, past medical history, past social history, past surgical history and problem list.  ROS Otherwise as in subjective above  Objective: BP (!) 140/92   Pulse 60   Wt 232 lb 6.4 oz (105.4 kg)   SpO2 98%   BMI 32.41 kg/m   General appearance: alert, no distress, well developed, well nourished Back nontender but seems to be in pain when standing straight.  Relieved a little bit bending over.  Range of motion is relatively full.  No deformity or swelling. Legs nontender to palpation, no swelling, normal range of motion. Abdomen: +bs, soft, non tender, non distended, no masses, no hepatomegaly, no splenomegaly Pulses: 2+ radial pulses, 2+ pedal pulses, normal cap refill Ext: no edema Positive straight leg raise on the right to 30 degrees.  Otherwise normal strength sensation and DTRs.  He does have pain with heel and toe walk.  Toe walk is limited due to pain on the right leg    Assessment: Encounter Diagnoses  Name Primary?  . Sciatica of right side Yes  . Acute right-sided low  back pain with right-sided sciatica   . Right leg pain      Plan: We discussed possible differential.  I suspect sciatica.  Begin medication as below, rest, hydrate well, stretch.   discussed sedation with medication.  I would expect mostly resolution within a week.  If worse or not improving or if new symptoms in 7 to 10 days then recheck.  Note given for work  Billy Tate was seen today for back pain.  Diagnoses and all orders for this visit:  Sciatica of right side  Acute right-sided low back pain with right-sided sciatica  Right leg pain  Other orders -     predniSONE (DELTASONE) 10 MG tablet; 6/5/4/3/2/1 -     HYDROcodone-acetaminophen (NORCO) 5-325 MG tablet; Take 1 tablet by mouth every 6 (six) hours as needed for moderate pain.    Follow up: prn

## 2019-12-02 ENCOUNTER — Telehealth: Payer: Self-pay | Admitting: Family Medicine

## 2019-12-02 ENCOUNTER — Other Ambulatory Visit: Payer: Self-pay | Admitting: Medical

## 2019-12-02 DIAGNOSIS — M5441 Lumbago with sciatica, right side: Secondary | ICD-10-CM

## 2019-12-02 DIAGNOSIS — M5431 Sciatica, right side: Secondary | ICD-10-CM

## 2019-12-02 DIAGNOSIS — M79604 Pain in right leg: Secondary | ICD-10-CM

## 2019-12-02 NOTE — Telephone Encounter (Signed)
Pt called and states that he is still having the back pain, states he took all of the steroids and the pain pills, he was given he is having trouble sleeping and walking from the pain, pt wants to know what he needs to do next pt uses [id number] - [place], [place] - [address]

## 2019-12-02 NOTE — Telephone Encounter (Signed)
If not much improvement since visit, have him go for xray, and make f/u appt to re-examine  Please go to [facility] for your lumbar xray.   Their hours are 8am - 4:30 pm Monday - Friday.  Take your insurance card with you.  [facility] [phone]  [address], [address] [place], [place] [address]  [address] [place], [place] [address]

## 2019-12-03 NOTE — Telephone Encounter (Signed)
Patient has been informed to go to imaging for x-ray.

## 2019-12-04 ENCOUNTER — Other Ambulatory Visit: Payer: Self-pay

## 2019-12-04 ENCOUNTER — Ambulatory Visit
Admission: RE | Admit: 2019-12-04 | Discharge: 2019-12-04 | Disposition: A | Discharge status: Home / Self Care | Payer: No Typology Code available for payment source | Source: Ambulatory Visit | Attending: Medical | Admitting: Medical

## 2019-12-04 DIAGNOSIS — M5431 Sciatica, right side: Secondary | ICD-10-CM

## 2019-12-04 DIAGNOSIS — M5441 Lumbago with sciatica, right side: Secondary | ICD-10-CM

## 2019-12-04 DIAGNOSIS — M79604 Pain in right leg: Secondary | ICD-10-CM

## 2019-12-06 MED ORDER — HYDROCODONE-ACETAMINOPHEN 5-325 MG PO TABS: 1.0000 | ORAL_TABLET | Freq: Four times a day (QID) | ORAL | 0 refills | Status: DC | PRN

## 2019-12-09 ENCOUNTER — Other Ambulatory Visit: Payer: Self-pay

## 2019-12-09 ENCOUNTER — Telehealth: Payer: Self-pay

## 2019-12-09 DIAGNOSIS — M5431 Sciatica, right side: Secondary | ICD-10-CM

## 2019-12-09 DIAGNOSIS — M79604 Pain in right leg: Secondary | ICD-10-CM

## 2019-12-09 DIAGNOSIS — M5441 Lumbago with sciatica, right side: Secondary | ICD-10-CM

## 2019-12-09 NOTE — Telephone Encounter (Signed)
Schedule him back with Dr. Susann Givens or myself.  Dr. Susann Givens is his PCP.  He needs follow-up regularly discussed and make sure he is scheduled.

## 2019-12-09 NOTE — Telephone Encounter (Signed)
Patient stated he will need a work note as he has been out of work since 11/24/19. Please advise.

## 2019-12-09 NOTE — Telephone Encounter (Signed)
Patient stated if he sits or stands for a period of time pain shoots down his leg. Patient needs paperwork filled out by his job since he has been out of work since the 13th.

## 2019-12-09 NOTE — Telephone Encounter (Signed)
If his pain is still so bad that he can work then schedule a follow-up ASAP to reexamine.  We may to need to get an MRI or refer to Ortho if doing that bad.

## 2019-12-10 ENCOUNTER — Encounter: Payer: Self-pay | Admitting: Family Medicine

## 2019-12-10 ENCOUNTER — Ambulatory Visit: Payer: Commercial Managed Care - PPO | Admitting: Family Medicine

## 2019-12-10 ENCOUNTER — Other Ambulatory Visit: Payer: Self-pay

## 2019-12-10 VITALS — BP 142/96 | HR 81 | Temp 97.1°F | Wt 231.0 lb

## 2019-12-10 DIAGNOSIS — M5431 Sciatica, right side: Secondary | ICD-10-CM | POA: Diagnosis not present

## 2019-12-10 DIAGNOSIS — M79604 Pain in right leg: Secondary | ICD-10-CM

## 2019-12-10 MED ORDER — HYDROCODONE-ACETAMINOPHEN 5-325 MG PO TABS: 1.0000 | ORAL_TABLET | Freq: Four times a day (QID) | ORAL | 0 refills | Status: AC | PRN

## 2019-12-10 NOTE — Patient Instructions (Signed)
Take 2 Aleve twice per day regularly and you can also take 2 Tylenol 4 times per day for general pain relief.  Use the codeine if this does not work

## 2019-12-10 NOTE — Progress Notes (Signed)
   Subjective:    Patient ID: Billy Tate, male    DOB: 1965/06/16, 54 y.o.   MRN: [id number]  HPI He is here for recheck on his back pain.  He has been able to work since the 15th.  X-rays on the 24th did show some degenerative changes.  On his visit on November 15 he was given a steroid and codeine for pain relief.  This did require a refill on that.  He has been taking codeine twice per day but has not been using the NSAID on the same schedule.  His pain is now a 7 out of 10 and he feels roughly 40% better overall.   Review of Systems     Objective:   Physical Exam Exam of his back shows no tenderness to palpation.  Straight leg raising was negative.  DTR was slightly diminished on the right.  Sensation is normal.  Strength appears normal specifically plantar and dorsiflexion.       Assessment & Plan:  Sciatica of right side - Plan: HYDROcodone-acetaminophen (NORCO) 5-325 MG tablet  Right leg pain He is to take 2 Aleve twice per day and 2 Tylenol 4 times per day regularly and use the codeine as backup for that.  I am to recheck him on December 10 and then start him back into physical therapy with the idea of returning to work around 20 December. Short-term disability as well on this FMLA forms were filled out. Over 30 minutes spent in reviewing his medical record, x-rays and filling out paperwork as well as evaluation and treatment.

## 2019-12-10 NOTE — Telephone Encounter (Signed)
Lmom for patient to call back and schedule a follow up appointment with Dr. Susann Givens or Vincenza Hews.

## 2019-12-12 ENCOUNTER — Telehealth: Payer: Self-pay

## 2019-12-12 NOTE — Telephone Encounter (Signed)
Spoke to pt and advised there is a section he must complete before form  Can be faxed. Pt stated that he will come by today or tomorrow. KH

## 2019-12-13 ENCOUNTER — Telehealth: Payer: Self-pay

## 2019-12-13 NOTE — Telephone Encounter (Signed)
Recv'd fax for P.A. HYDROCODONE/ACET, called pharmacy & they couldn't explain why P.A. except issue with days/filled.  Called plan t# [phone] & did P.A. over the phone.  Also in the meantime activated Good Rx discount card, called pt and informed of issue and waiting approval of P.A. but in meantime can use discount card,  I printed and sent to his e-mail.

## 2019-12-17 ENCOUNTER — Telehealth: Payer: Self-pay | Admitting: Family Medicine

## 2019-12-17 NOTE — Telephone Encounter (Signed)
[facility] Prior Auth on [address]

## 2019-12-18 ENCOUNTER — Encounter: Payer: PRIVATE HEALTH INSURANCE | Admitting: Family Medicine

## 2019-12-19 ENCOUNTER — Ambulatory Visit: Payer: Commercial Managed Care - PPO | Admitting: Family Medicine

## 2019-12-19 ENCOUNTER — Other Ambulatory Visit: Payer: Self-pay

## 2019-12-19 VITALS — BP 146/100 | HR 86 | Temp 96.7°F | Wt 233.0 lb

## 2019-12-19 DIAGNOSIS — R030 Elevated blood-pressure reading, without diagnosis of hypertension: Secondary | ICD-10-CM

## 2019-12-19 DIAGNOSIS — M5431 Sciatica, right side: Secondary | ICD-10-CM | POA: Diagnosis not present

## 2019-12-19 NOTE — Progress Notes (Signed)
   Subjective:    Patient ID: Billy Tate, male    DOB: 1965/07/09, 54 y.o.   MRN: [id number]  HPI He is here for recheck.  He states that he is maybe 50% better but unable to stand for any long period of time without pain that radiates into his thigh and down to the knee.   Review of Systems     Objective:   Physical Exam Alert and complaining of back pain.  Normal lumbar motion.  Normal sensation.  Right knee reflexes diminished.  Ankle reflexes normal.  Negative straight leg raising.  Blood pressure is recorded.       Assessment & Plan:  Sciatica of right side - Plan: MR Lumbar Spine Wo Contrast  Elevated blood pressure reading I will see if I can get an MRI since he is really not made much of an improvement over the last month.  If not able to do this, I will refer to orthopedics. I then discussed the blood pressure with him.  We will continue to monitor this and possibly use medications if needed.  He was comfortable with that.

## 2019-12-19 NOTE — Telephone Encounter (Signed)
P.A. denied, only approved for dx of cancer or pain after surgery or dental procedure.  Pt has Good Rx card

## 2019-12-20 ENCOUNTER — Telehealth: Payer: Self-pay

## 2019-12-20 NOTE — Telephone Encounter (Signed)
Handled on other telephone call

## 2019-12-20 NOTE — Telephone Encounter (Signed)
Pt has advise his MRI is scheduled until 01/15/19. Pt note was good through 12/20/19. Pt would like an extension on his excuse note for for. Please advise [facility]

## 2019-12-20 NOTE — Telephone Encounter (Signed)
Lets work on getting that sooner.  That is way too long to wait.  We can give him a note to cover 1 day past the MRI

## 2019-12-24 ENCOUNTER — Other Ambulatory Visit: Payer: Self-pay

## 2019-12-24 DIAGNOSIS — M5431 Sciatica, right side: Secondary | ICD-10-CM

## 2019-12-24 DIAGNOSIS — M5441 Lumbago with sciatica, right side: Secondary | ICD-10-CM

## 2019-12-24 DIAGNOSIS — M79604 Pain in right leg: Secondary | ICD-10-CM

## 2019-12-24 NOTE — Telephone Encounter (Signed)
Have tried to get in touch scheduling at [facility]. Was advised today that there is no longer a cancellation list and pt will have to call everyday to find out if the can see him before 01/15/20 . This makes it hard to get the note for work . Please advise [facility]

## 2019-12-24 NOTE — Telephone Encounter (Signed)
Called and sent my chart message due to no answer. KH

## 2019-12-24 NOTE — Telephone Encounter (Signed)
Let him know that we are having trouble getting the procedure ordered and go ahead and refer him to orthopedics

## 2019-12-25 NOTE — Telephone Encounter (Signed)
Called pt wife no answer and LVM . KH

## 2019-12-26 NOTE — Telephone Encounter (Signed)
Pt was advised and understands. KH

## 2019-12-27 ENCOUNTER — Ambulatory Visit: Payer: Commercial Managed Care - PPO | Admitting: Medical

## 2019-12-27 ENCOUNTER — Ambulatory Visit: Payer: Commercial Managed Care - PPO | Admitting: Family Medicine

## 2019-12-27 ENCOUNTER — Ambulatory Visit: Payer: Commercial Managed Care - PPO | Admitting: Orthopaedic Surgery

## 2020-01-01 ENCOUNTER — Other Ambulatory Visit: Payer: Self-pay

## 2020-01-01 ENCOUNTER — Encounter: Payer: Self-pay | Admitting: Orthopaedic Surgery

## 2020-01-01 ENCOUNTER — Ambulatory Visit: Payer: Commercial Managed Care - PPO | Admitting: Orthopaedic Surgery

## 2020-01-01 VITALS — Ht 71.0 in | Wt 232.6 lb

## 2020-01-01 DIAGNOSIS — M5431 Sciatica, right side: Secondary | ICD-10-CM | POA: Diagnosis not present

## 2020-01-01 MED ORDER — METHOCARBAMOL 500 MG PO TABS: 500.0000 mg | ORAL_TABLET | Freq: Two times a day (BID) | ORAL | 0 refills | Status: AC | PRN

## 2020-01-01 MED ORDER — PREDNISONE 10 MG (21) PO TBPK: ORAL_TABLET | ORAL | 0 refills | Status: AC

## 2020-01-01 NOTE — Progress Notes (Signed)
Office Visit Note   Patient: Billy Tate           Date of Birth: 1965-03-06           MRN: [id number] Visit Date: 01/01/2020              Requested by: Ronnald Nian, MD [address] [place],  [place] [address] PCP: Ronnald Nian, MD   Assessment & Plan: Visit Diagnoses:  1. Sciatica of right side     Plan: Impression is right-sided lower back pain and right lower extremity sciatica.  The patient will now continue with his Aleve and Norco as needed.  I will call in Robaxin to take as needed as well.  Due to the upcoming holidays and his MRI is not scheduled till December 30, have agreed to call in another steroid Dosepak if his symptoms worsen.  He will follow up with [place] after the MRI.  Follow-Up Instructions: Return for after MRI.   Orders:  No orders of the defined types were placed in this encounter.  Meds ordered this encounter  Medications  . predniSONE (STERAPRED UNI-PAK 21 TAB) 10 MG (21) TBPK tablet    Sig: Take as directed    Dispense:  21 tablet    Refill:  0  . methocarbamol (ROBAXIN) 500 MG tablet    Sig: Take 1 tablet (500 mg total) by mouth 2 (two) times daily as needed.    Dispense:  10 tablet    Refill:  0      Procedures: No procedures performed   Clinical Data: No additional findings.   Subjective: Chief Complaint  Patient presents with  . Lower Back - Pain  . Right Hip - Pain    HPI patient is a pleasant 54 year old gentleman who comes in today with right lower back pain, lateral hip pain as well as numbness to the inner thigh for the past several months.  He was initially seen by his PCP where x-rays were ordered and he was started on a steroid.  His symptoms have improved quite a bit.  He does have an upcoming MRI of the lumbar spine on December 30 of this year.  Comes in today for further evaluation treatment recommendation.  The pain he has is worse with standing as well as lying flat.  He takes Aleve and occasional Norco with  mild relief of symptoms.  No weakness.  No bowel or bladder changes.  No previous history of lumbar injection or surgical intervention. Review of Systems as detailed in HPI.  All others reviewed and are negative.   Objective: Vital Signs: Ht 5\' 11"  (1.803 m)   Wt 232 lb 9.6 oz (105.5 kg)   BMI 32.44 kg/m   Physical Exam well-developed well-nourished gentleman in no acute distress.  Alert and oriented x3.  Ortho Exam lumbar exam shows mild paraspinous tenderness on the right.  Positive straight leg raise on the right.  Negative logroll negative FADIR.  Is increased pain with lumbar extension and rotation.  He is neurovascularly intact distally.  No focal weakness.  Specialty Comments:  No specialty comments available.  Imaging: No new imaging   PMFS History: Patient Active Problem List   Diagnosis Date Noted  . Right leg pain 11/25/2019  . Acute right-sided low back pain with right-sided sciatica 11/25/2019  . Sciatica of right side 11/25/2019  . Family history of heart disease 10/13/2017  . PVC's (premature ventricular contractions) 12/21/2012  . Hyperlipidemia with target LDL less  than 100 10/08/2010  . Obesity (BMI 30-39.9) 10/08/2010   Past Medical History:  Diagnosis Date  . Dyslipidemia   . Hemorrhoids     History reviewed. No pertinent family history.  Past Surgical History:  Procedure Laterality Date  . APPENDECTOMY  1993   Social History   Occupational History  . Not on file  Tobacco Use  . Smoking status: Never Smoker  . Smokeless tobacco: Never Used  Substance and Sexual Activity  . Alcohol use: No  . Drug use: No  . Sexual activity: Yes

## 2020-01-09 ENCOUNTER — Ambulatory Visit
Admission: RE | Admit: 2020-01-09 | Discharge: 2020-01-09 | Disposition: A | Discharge status: Home / Self Care | Payer: Commercial Managed Care - PPO | Source: Ambulatory Visit | Attending: Family Medicine | Admitting: Family Medicine

## 2020-01-09 ENCOUNTER — Other Ambulatory Visit: Payer: Self-pay

## 2020-01-09 DIAGNOSIS — M5431 Sciatica, right side: Secondary | ICD-10-CM

## 2020-01-14 ENCOUNTER — Encounter: Payer: Self-pay | Admitting: Orthopaedic Surgery

## 2020-01-14 ENCOUNTER — Other Ambulatory Visit: Payer: Self-pay

## 2020-01-14 ENCOUNTER — Ambulatory Visit: Payer: Commercial Managed Care - PPO | Admitting: Orthopaedic Surgery

## 2020-01-14 DIAGNOSIS — M5441 Lumbago with sciatica, right side: Secondary | ICD-10-CM

## 2020-01-14 NOTE — Progress Notes (Signed)
   Office Visit Note   Patient: Billy Tate           Date of Birth: 1965-02-02           MRN: [id number] Visit Date: 01/14/2020              Requested by: Ronnald Nian, MD [address] [place],  [place] [address] PCP: Ronnald Nian, MD   Assessment & Plan: Visit Diagnoses:  1. Acute right-sided low back pain with right-sided sciatica     Plan: Impression is bilateral neuroforaminal narrowing L3-4 and L4-5 with continued pain although slightly better.  At this point, he feels as though he is in too much pain to start physical therapy.  We will refer him to Dr. Alvester Morin for epidural steroid injection.  He will follow up with [place] as needed.  Follow-Up Instructions: Return for with Dr. Alvester Morin.   Orders:  No orders of the defined types were placed in this encounter.  No orders of the defined types were placed in this encounter.     Procedures: No procedures performed   Clinical Data: No additional findings.   Subjective: Chief Complaint  Patient presents with  . Lower Back - Pain    HPI patient is a pleasant 55 year old gentleman who comes in today to review MRI results of the lumbar spine.  He has been dealing with right-sided lower back pain and lower extremity radiculopathy for several months.  He has been taking medications but has been in too much pain for physical therapy.  MRI of the lumbar spine was ordered which showed moderate bilateral neural foraminal narrowing L3-4 and L4-5 without significant spinal canal stenosis.  No previous epidural steroid injection.     Objective: Vital Signs: There were no vitals taken for this visit.    Ortho Exam stable lumbar exam  Specialty Comments:  No specialty comments available.  Imaging: No new imaging   PMFS History: Patient Active Problem List   Diagnosis Date Noted  . Right leg pain 11/25/2019  . Acute right-sided low back pain with right-sided sciatica 11/25/2019  . Sciatica of right side  11/25/2019  . Family history of heart disease 10/13/2017  . PVC's (premature ventricular contractions) 12/21/2012  . Hyperlipidemia with target LDL less than 100 10/08/2010  . Obesity (BMI 30-39.9) 10/08/2010   Past Medical History:  Diagnosis Date  . Dyslipidemia   . Hemorrhoids     History reviewed. No pertinent family history.  Past Surgical History:  Procedure Laterality Date  . APPENDECTOMY  1993   Social History   Occupational History  . Not on file  Tobacco Use  . Smoking status: Never Smoker  . Smokeless tobacco: Never Used  Substance and Sexual Activity  . Alcohol use: No  . Drug use: No  . Sexual activity: Yes

## 2020-01-15 ENCOUNTER — Other Ambulatory Visit: Payer: Commercial Managed Care - PPO

## 2020-01-21 ENCOUNTER — Telehealth: Payer: Self-pay | Admitting: Physical Medicine and Rehabilitation

## 2020-01-21 NOTE — Telephone Encounter (Signed)
Pt called stating they were supposed to have an appt but hasn't heard anything; I did let them know their referral is pending review. Pt would like a CB to get scheduled  [phone]

## 2020-01-22 NOTE — Telephone Encounter (Signed)
Called pt and LVM #1 

## 2020-01-31 ENCOUNTER — Encounter: Payer: Self-pay | Admitting: Orthopaedic Surgery

## 2020-02-06 NOTE — Telephone Encounter (Signed)
Yes that's fine.  Whatever she wants.

## 2020-02-07 ENCOUNTER — Telehealth: Payer: Self-pay | Admitting: Orthopaedic Surgery

## 2020-02-07 NOTE — Telephone Encounter (Signed)
Patient called. He would like a note sent to his job with the first date he started coming to [place] to present on it. It should be faxed to [phone] attn: Darsell Hargraves. His call back number is [phone]

## 2020-02-07 NOTE — Telephone Encounter (Signed)
Patient portal message sent to pt informing him

## 2020-02-07 NOTE — Telephone Encounter (Signed)
Letter completed and faxed.

## 2020-02-13 ENCOUNTER — Telehealth: Payer: Self-pay | Admitting: Orthopaedic Surgery

## 2020-02-13 ENCOUNTER — Ambulatory Visit: Payer: Commercial Managed Care - PPO | Admitting: Physical Medicine and Rehabilitation

## 2020-02-13 ENCOUNTER — Ambulatory Visit: Payer: Self-pay

## 2020-02-13 ENCOUNTER — Other Ambulatory Visit: Payer: Self-pay

## 2020-02-13 ENCOUNTER — Encounter: Payer: Self-pay | Admitting: Physical Medicine and Rehabilitation

## 2020-02-13 VITALS — BP 152/101 | HR 78

## 2020-02-13 DIAGNOSIS — M5416 Radiculopathy, lumbar region: Secondary | ICD-10-CM | POA: Diagnosis not present

## 2020-02-13 MED ORDER — BETAMETHASONE SOD PHOS & ACET 6 (3-3) MG/ML IJ SUSP
12.0000 mg | Freq: Once | INTRAMUSCULAR | Status: AC
  Administered 2020-02-13: 12 mg

## 2020-02-13 NOTE — Progress Notes (Signed)
Billy Tate - 55 y.o. male MRN [id number]  Date of birth: 08/14/1965  Office Visit Note: Visit Date: 02/13/2020 PCP: Ronnald Nian, MD Referred by: Ronnald Nian, MD  Subjective: Chief Complaint  Patient presents with  . Lower Back - Pain   HPI:  Billy Tate is a 55 y.o. male who comes in today at the request of Dr. Glee Arvin for planned Right L5-S1 Lumbar epidural steroid injection with fluoroscopic guidance.  The patient has failed conservative care including home exercise, medications, time and activity modification.  This injection will be diagnostic and hopefully therapeutic.  Please see requesting physician notes for further details and justification. Initial radicular complaints on the right. Questions about work restrictions deferred to Dr. Roda Shutters at this point.   MRI reviewed with images and spine model.  MRI reviewed in the note below.   Conisder diagnostic medial branch/facets. Recommend PT for spine stabilization, core strength and HEP.  ROS Otherwise per HPI.  Assessment & Plan: Visit Diagnoses:    ICD-10-CM   1. Lumbar radiculopathy  M54.16 XR C-ARM NO REPORT    Epidural Steroid injection    betamethasone acetate-betamethasone sodium phosphate (CELESTONE) injection 12 mg    Plan: No additional findings.   Meds & Orders:  Meds ordered this encounter  Medications  . betamethasone acetate-betamethasone sodium phosphate (CELESTONE) injection 12 mg    Orders Placed This Encounter  Procedures  . XR C-ARM NO REPORT  . Epidural Steroid injection    Follow-up: Return if symptoms worsen or fail to improve.   Procedures: No procedures performed  Lumbar Epidural Steroid Injection - Interlaminar Approach with Fluoroscopic Guidance  Patient: Billy Tate      Date of Birth: 1965/11/04 MRN: [id number] PCP: Ronnald Nian, MD      Visit Date: 02/13/2020   Universal Protocol:     Consent Given By: the patient  Position: PRONE  Additional Comments: Vital signs  were monitored before and after the procedure. Patient was prepped and draped in the usual sterile fashion. The correct patient, procedure, and site was verified.   Injection Procedure Details:   Procedure diagnoses: Lumbar radiculopathy [M54.16]   Meds Administered:  Meds ordered this encounter  Medications  . betamethasone acetate-betamethasone sodium phosphate (CELESTONE) injection 12 mg     Laterality: Right  Location/Site:  L5-S1  Needle: 3.5 in., 20 ga. Tuohy  Needle Placement: Paramedian epidural  Findings:   -Comments: Excellent flow of contrast into the epidural space.  Procedure Details: Using a paramedian approach from the side mentioned above, the region overlying the inferior lamina was localized under fluoroscopic visualization and the soft tissues overlying this structure were infiltrated with 4 ml. of 1% Lidocaine without Epinephrine. The Tuohy needle was inserted into the epidural space using a paramedian approach.   The epidural space was localized using loss of resistance along with counter oblique bi-planar fluoroscopic views.  After negative aspirate for air, blood, and CSF, a 2 ml. volume of Isovue-250 was injected into the epidural space and the flow of contrast was observed. Radiographs were obtained for documentation purposes.    The injectate was administered into the level noted above.   Additional Comments:  The patient tolerated the procedure well Dressing: 2 x 2 sterile gauze and Band-Aid    Post-procedure details: Patient was observed during the procedure. Post-procedure instructions were reviewed.  Patient left the clinic in stable condition.     Clinical History: MRI LUMBAR SPINE WITHOUT CONTRAST  TECHNIQUE: Multiplanar, multisequence  MR imaging of the lumbar spine was performed. No intravenous contrast was administered.  COMPARISON:  Plain films December 04, 2019.  FINDINGS: Segmentation:  Standard.  Alignment:   Physiologic.  Vertebrae:  No fracture, evidence of discitis, or bone lesion.  Conus medullaris and cauda equina: Conus extends to the L1-2 level. Conus and cauda equina appear normal.  Paraspinal and other soft tissues: Negative.  Disc levels:  T12-L1:No spinal canal or neural foraminal stenosis.  L1-2:No spinal canal or neural foraminal stenosis.  L2-3:No spinal canal or neural foraminal stenosis.  L3-4:Loss of disc height, disc bulge, facet degenerative changes ligamentum flavum redundancy resulting in moderate bilateral neural foraminal narrowing. No significant spinal canal stenosis.  L4-5:Loss disc height, disc bulge, facet degenerative changes and ligamentum flavum redundancy resulting in moderate bilateral neural foraminal narrowing. No spinal canal stenosis. There is also mild inter spinous ligament edema.  L5-S1:Small central disc protrusion and mild facet degenerative changes. No spinal canal or neural foraminal stenosis.  IMPRESSION: Mild degenerative changes of the lumbar spine with moderate bilateral neural foraminal narrowing at L3-L4 and L4-L5. No significant spinal canal stenosis at any level.   Electronically Signed   By: Baldemar Lenis M.D.   On: 01/09/2020 15:36     Objective:  VS:  HT:    WT:   BMI:     BP:(!) 152/101  HR:78bpm  TEMP: ( )  RESP:  Physical Exam Vitals and nursing note reviewed.  Constitutional:      General: He is not in acute distress.    Appearance: Normal appearance. He is not ill-appearing.  HENT:     Head: Normocephalic and atraumatic.     Right Ear: External ear normal.     Left Ear: External ear normal.     Nose: No congestion.  Eyes:     Extraocular Movements: Extraocular movements intact.  Cardiovascular:     Rate and Rhythm: Normal rate.     Pulses: Normal pulses.  Pulmonary:     Effort: Pulmonary effort is normal. No respiratory distress.  Abdominal:     General: There is no  distension.     Palpations: Abdomen is soft.  Musculoskeletal:        General: No tenderness or signs of injury.     Cervical back: Neck supple.     Right lower leg: No edema.     Left lower leg: No edema.     Comments: Patient has good distal strength without clonus.  Skin:    Findings: No erythema or rash.  Neurological:     General: No focal deficit present.     Mental Status: He is alert and oriented to person, place, and time.     Sensory: No sensory deficit.     Motor: No weakness or abnormal muscle tone.     Coordination: Coordination normal.  Psychiatric:        Mood and Affect: Mood normal.        Behavior: Behavior normal.      Imaging: No results found.

## 2020-02-13 NOTE — Procedures (Signed)
Lumbar Epidural Steroid Injection - Interlaminar Approach with Fluoroscopic Guidance  Patient: Billy Tate      Date of Birth: [birth year] MRN: [id number] PCP: Ronnald Nian, MD      Visit Date: 02/13/2020   Universal Protocol:     Consent Given By: the patient  Position: PRONE  Additional Comments: Vital signs were monitored before and after the procedure. Patient was prepped and draped in the usual sterile fashion. The correct patient, procedure, and site was verified.   Injection Procedure Details:   Procedure diagnoses: Lumbar radiculopathy [M54.16]   Meds Administered:  Meds ordered this encounter  Medications  . betamethasone acetate-betamethasone sodium phosphate (CELESTONE) injection 12 mg     Laterality: Right  Location/Site:  L5-S1  Needle: 3.5 in., 20 ga. Tuohy  Needle Placement: Paramedian epidural  Findings:   -Comments: Excellent flow of contrast into the epidural space.  Procedure Details: Using a paramedian approach from the side mentioned above, the region overlying the inferior lamina was localized under fluoroscopic visualization and the soft tissues overlying this structure were infiltrated with 4 ml. of 1% Lidocaine without Epinephrine. The Tuohy needle was inserted into the epidural space using a paramedian approach.   The epidural space was localized using loss of resistance along with counter oblique bi-planar fluoroscopic views.  After negative aspirate for air, blood, and CSF, a 2 ml. volume of Isovue-250 was injected into the epidural space and the flow of contrast was observed. Radiographs were obtained for documentation purposes.    The injectate was administered into the level noted above.   Additional Comments:  The patient tolerated the procedure well Dressing: 2 x 2 sterile gauze and Band-Aid    Post-procedure details: Patient was observed during the procedure. Post-procedure instructions were reviewed.  Patient left the clinic  in stable condition.

## 2020-02-13 NOTE — Telephone Encounter (Signed)
FAXED

## 2020-02-13 NOTE — Telephone Encounter (Signed)
Patient was here to see Dr. Alvester Morin. He would like a note faxed to his job stating that he will be out until his FU with Dr. Roda Shutters 02/20/2020. It should be faxed to [phone] attn: Darsell Hargraves. His call back number is [phone]

## 2020-02-13 NOTE — Patient Instructions (Signed)

## 2020-02-13 NOTE — Telephone Encounter (Signed)
sure

## 2020-02-13 NOTE — Telephone Encounter (Signed)
Okay to do this. 

## 2020-02-13 NOTE — Progress Notes (Signed)
.  Numeric Pain Rating Scale and Functional Assessment Average Pain 7   In the last MONTH (on 0-10 scale) has pain interfered with the following?  1. General activity like being  able to carry out your everyday physical activities such as walking, climbing stairs, carrying groceries, or moving a chair?  Rating(8)   +Driver, -BT, -Dye Allergies.  

## 2020-02-20 ENCOUNTER — Other Ambulatory Visit: Payer: Self-pay

## 2020-02-20 ENCOUNTER — Ambulatory Visit: Payer: Commercial Managed Care - PPO | Admitting: Orthopaedic Surgery

## 2020-02-20 ENCOUNTER — Encounter: Payer: Self-pay | Admitting: Orthopaedic Surgery

## 2020-02-20 ENCOUNTER — Telehealth: Payer: Self-pay

## 2020-02-20 DIAGNOSIS — M5441 Lumbago with sciatica, right side: Secondary | ICD-10-CM

## 2020-02-20 NOTE — Telephone Encounter (Signed)
Faxed to [phone] Attn Darsell Hargraves per patients request.

## 2020-02-20 NOTE — Progress Notes (Signed)
   Office Visit Note   Patient: Billy Tate           Date of Birth: December 27, 1965           MRN: [id number] Visit Date: 02/20/2020              Requested by: Ronnald Nian, MD [address] [place],  [place] [address] PCP: Ronnald Nian, MD   Assessment & Plan: Visit Diagnoses:  1. Acute right-sided low back pain with right-sided sciatica     Plan: Impression is lumbar radiculopathy with improvement from recent ESI.  Based on our lengthy discussion today we agreed to place him on restrictions for at least another 6 weeks while he has a chance to do outpatient physical therapy.  Work note provided today.  We will recheck things in 6 weeks.  We also discussed that he may want to look into less physically demanding jobs if his back is a concern to him.  Total face to face encounter time was greater than 25 minutes and over half of this time was spent in counseling and/or coordination of care.  Follow-Up Instructions: Return if symptoms worsen or fail to improve.   Orders:  Orders Placed This Encounter  Procedures  . Ambulatory referral to Physical Therapy   No orders of the defined types were placed in this encounter.     Procedures: No procedures performed   Clinical Data: No additional findings.   Subjective: Chief Complaint  Patient presents with  . Lower Back - Pain    Mr. Ambrosia returns today for follow-up of his back pain status post ESI injection by Dr. Alvester Morin about a week ago.  He feels about 30 to 40% better.  He still has some aching pain when he sits for too long.  Overall doing okay.  He still remains out of work with [employer].   Review of Systems   Objective: Vital Signs: There were no vitals taken for this visit.  Physical Exam  Ortho Exam Exam is stable. Specialty Comments:  No specialty comments available.  Imaging: No results found.   PMFS History: Patient Active Problem List   Diagnosis Date Noted  . Right leg pain 11/25/2019  .  Acute right-sided low back pain with right-sided sciatica 11/25/2019  . Sciatica of right side 11/25/2019  . Family history of heart disease 10/13/2017  . PVC's (premature ventricular contractions) 12/21/2012  . Hyperlipidemia with target LDL less than 100 10/08/2010  . Obesity (BMI 30-39.9) 10/08/2010   Past Medical History:  Diagnosis Date  . Dyslipidemia   . Hemorrhoids     History reviewed. No pertinent family history.  Past Surgical History:  Procedure Laterality Date  . APPENDECTOMY  1993   Social History   Occupational History  . Not on file  Tobacco Use  . Smoking status: Never Smoker  . Smokeless tobacco: Never Used  Substance and Sexual Activity  . Alcohol use: No  . Drug use: No  . Sexual activity: Yes

## 2020-03-02 ENCOUNTER — Ambulatory Visit: Payer: Commercial Managed Care - PPO | Admitting: Physical Therapy

## 2020-03-02 ENCOUNTER — Encounter: Payer: Self-pay | Admitting: Physical Therapy

## 2020-03-02 ENCOUNTER — Other Ambulatory Visit: Payer: Self-pay

## 2020-03-02 DIAGNOSIS — R262 Difficulty in walking, not elsewhere classified: Secondary | ICD-10-CM | POA: Diagnosis not present

## 2020-03-02 DIAGNOSIS — G8929 Other chronic pain: Secondary | ICD-10-CM

## 2020-03-02 DIAGNOSIS — M545 Low back pain, unspecified: Secondary | ICD-10-CM

## 2020-03-02 DIAGNOSIS — M6281 Muscle weakness (generalized): Secondary | ICD-10-CM

## 2020-03-02 NOTE — Therapy (Signed)
[facility] [address] [place], [place], [address] Phone: [phone]   Fax:  [phone]  Physical Therapy Evaluation  Patient Details  Name: Billy Tate MRN: [id number] Date of Birth: [birth year] Referring Provider (PT): Gershon MusselXu, Naiping MD   Encounter Date: 03/02/2020   PT End of Session - 03/02/20 1719    Visit Number 1    Number of Visits 12    Date for PT Re-Evaluation 04/24/20    PT Start Time 1100    PT Stop Time 1145    PT Time Calculation (min) 45 min    Activity Tolerance Patient tolerated treatment well    Behavior [facility] Therapy [facility] for tasks assessed/performed           Past Medical History:  Diagnosis Date  . Dyslipidemia   . Hemorrhoids     Past Surgical History:  Procedure Laterality Date  . APPENDECTOMY  1993    There were no vitals filed for this visit.    Subjective Assessment - 03/02/20 1110    Subjective Pt arriving to therapy reporting low back pain that began in November 2021 that progressively worsened over time. Pt reporting 4/10 at rest. Pt reporting 6/10 pain over the last week with certain activities. Pt works for [employer] and is worried about returning to work where he has to lift 50# multiple times throughout his shift. 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    Pertinent History PVC's, hyperlipidemia    Limitations Sitting;Lifting;Walking;Standing    How long can you sit comfortably? it depends on where i'm sitting, harder chair about 30 minutes    How long can you walk comfortably? 30 minutes    Patient Stated Goals Return to work without pain    Currently in Pain? Yes    Pain Score 4     Pain Location Back    Pain Orientation Right    Pain Descriptors / Indicators Aching    Pain Type Acute pain    Pain Radiating Towards at the begining pt rpeorting pain radiaiton down lateral and medial thigh and numbness to R calf    Pain Onset More than a month ago    Pain Frequency  Constant    Aggravating Factors  bending, standing, walking prolonded    Pain Relieving Factors resting, aleve    Effect of Pain on Daily Activities unable to work currently, lifting, household chores              [facility] - 03/02/20 0001      Assessment   Medical Diagnosis m54.41 R sided LBP with sciatica    Referring Provider (PT) Gershon MusselXu, Naiping MD    Onset Date/Surgical Date --   November 2021   Hand Dominance Right    Prior Therapy no      Precautions   Precautions None      Restrictions   Weight Bearing Restrictions No      Balance Screen   Has the patient fallen in the past 6 months No    Is the patient reluctant to leave their home because of a fear of falling?  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 [employer]      Cognition   Overall Cognitive Status Within Functional Limits  for tasks assessed      Observation/Other Assessments   Focus on Therapeutic Outcomes (FOTO)  47 (predicted 65)      Posture/Postural Control   Posture/Postural Control Postural limitations    Postural Limitations Forward head;Rounded Shoulders      ROM / Strength   AROM / PROM / Strength AROM;Strength      AROM   Overall AROM Comments pain noted with left side bending and left rotation    AROM Assessment Site Lumbar    Lumbar Flexion 40    Lumbar Extension 20    Lumbar - Right Side Bend 18    Lumbar - Left Side Bend 10    Lumbar - Right Rotation 75% function    Lumbar - Left Rotation 50% function      Strength   Strength Assessment Site Hip;Knee    Right/Left Hip Right;Left    Right Hip Flexion 4/5    Right Hip Extension 4/5    Right Hip External Rotation  4/5    Right Hip Internal Rotation 4/5    Right Hip ABduction 4/5    Right Hip ADduction 4/5    Left Hip Flexion 5/5    Left Hip Extension 5/5    Left  Hip External Rotation 5/5    Left Hip Internal Rotation 5/5    Left Hip ABduction 5/5    Left Hip ADduction 5/5    Right/Left Knee Left    Right Knee Flexion 4/5    Right Knee Extension 5/5    Left Knee Flexion 5/5    Left Knee Extension 5/5      Flexibility   Soft Tissue Assessment /Muscle Length yes    Hamstrings with opposite knee bent: R: 50 degrees, L: 68 degrees      Palpation   Palpation comment TTP: lumbar paraspinals, R piriformis      Transfers   Five time sit to stand comments  12 seconds no UE support      Ambulation/Gait   Assistive device None    Gait Pattern Step-through pattern    Gait Comments Mild forward flexed posture when first amb after standing from a seated position.                      Objective measurements completed on examination: See above findings.               PT Education - 03/02/20 1732    Education Details HEP, PT POC, basic anatomy and physiology    Person(s) Educated Patient    Methods Explanation;Demonstration;Handout;Verbal cues    Comprehension Returned demonstration;Verbalized understanding            PT Short Term Goals - 03/02/20 1135      PT SHORT TERM GOAL #1   Title Pt will be independent in his initial HEP.    Time 3    Period Weeks    Status New    Target Date 03/23/20             PT Long Term Goals - 03/02/20 1743      PT LONG TERM GOAL #1   Title Pt will improve his FOTO score from 47 to >/= 65.    Time 6    Period Weeks    Target Date 04/24/20      PT LONG TERM GOAL #2   Title Pt will be independent in his HEP and progress to community based exercise program.  Time 6    Period Weeks    Status New    Target Date 04/24/20      PT LONG TERM GOAL #3   Title Pt will improve his R LE strength to 5/5 for improved walking and funcitonal mobility.    Time 6    Period Weeks    Status New      PT LONG TERM GOAL #4   Title Pt will be able to lift 25# from floor to counter  height using proper body mechanics and no pain reported.    Time 6    Period Weeks    Status New    Target Date 04/24/20                  Plan - 03/02/20 1734    Clinical Impression Statement Mr. Fero arriving today for PT evaluation reporting ongoing history o worsening right sided low back pain with sciatica which he reports his radiculpathy has improved some. Pt with tenderness to palpation along lumbar paraspinals and right piriformis. Pt with mild weakness noted in R LE compared to the L LE and decreased AROM. Pt with incrased sharp and stabbing like  pain reported with repeated extension. Pt was issued a beginning HEP and pt able to return demonstration. Skilled PT needed to prgress pt toward his PLOF and return to work.    Examination-Activity Limitations Squat;Lift;Carry    Examination-Participation Restrictions Other;Community Activity    Stability/Clinical Decision Making Evolving/Moderate complexity    Clinical Decision Making Low    Rehab Potential Good    PT Frequency 2x / week    PT Duration 6 weeks    PT Treatment/Interventions Cryotherapy;Electrical Stimulation;Iontophoresis 4mg /ml Dexamethasone;Moist Heat;Traction;Ultrasound;Gait training;Stair training;Functional mobility training;Therapeutic activities;Patient/family education;Neuromuscular re-education;Balance training;Manual techniques;Dry needling;Passive range of motion;Taping;Therapeutic exercise    PT Next Visit Plan spinal mobs, try flexion based exercises for response, LE strengthening    PT Home Exercise Plan Access Code:  URL: [email or web address]  Date: 03/02/2020  Prepared by: 03/04/2020    Exercises  Supine Lower Trunk Rotation - 2 x daily - 7 x weekly - 5 reps - 20 seconds hold  Supine Bridge - 2 x daily - 7 x weekly - 2 sets - 10 reps - 5 seconds hold  Supine Piriformis Stretch with Foot on Ground - 2 x daily - 7 x weekly - 5 reps - 20 seconds hold  Cat-Camel - 2 x daily - 7 x  weekly - 10 reps - 10 seconds hold    Consulted and Agree with Plan of Care Patient           Patient will benefit from skilled therapeutic intervention in order to improve the following deficits and impairments:  Pain,Impaired flexibility,Obesity,Difficulty walking,Decreased activity tolerance,Decreased range of motion,Decreased strength,Postural dysfunction,Decreased mobility  Visit Diagnosis: Chronic right-sided low back pain without sciatica  Difficulty in walking, not elsewhere classified  Muscle weakness (generalized)     Problem List Patient Active Problem List   Diagnosis Date Noted  . Right leg pain 11/25/2019  . Acute right-sided low back pain with right-sided sciatica 11/25/2019  . Sciatica of right side 11/25/2019  . Family history of heart disease 10/13/2017  . PVC's (premature ventricular contractions) 12/21/2012  . Hyperlipidemia with target LDL less than 100 10/08/2010  . Obesity (BMI 30-39.9) 10/08/2010    10/10/2010, PT, MPT 03/02/2020, 5:51 PM  [facility] Physical Therapy [address] [place], [place], [place] Phone: [phone]   Fax:  [phone]  Name: Shea Kapur MRN: [id number] Date of Birth: 03/10/1965

## 2020-03-02 NOTE — Patient Instructions (Signed)
Access Code: [email or web address] URL: [email or web address] Date: 03/02/2020 Prepared by: Narda Amber  Exercises Supine Lower Trunk Rotation - 2 x daily - 7 x weekly - 5 reps - 20 seconds hold Supine Bridge - 2 x daily - 7 x weekly - 2 sets - 10 reps - 5 seconds hold Supine Piriformis Stretch with Foot on Ground - 2 x daily - 7 x weekly - 5 reps - 20 seconds hold Cat-Camel - 2 x daily - 7 x weekly - 10 reps - 10 seconds hold

## 2020-03-04 ENCOUNTER — Other Ambulatory Visit: Payer: Self-pay

## 2020-03-04 ENCOUNTER — Ambulatory Visit (INDEPENDENT_AMBULATORY_CARE_PROVIDER_SITE_OTHER): Payer: Commercial Managed Care - PPO | Admitting: Physical Therapy

## 2020-03-04 DIAGNOSIS — G8929 Other chronic pain: Secondary | ICD-10-CM | POA: Diagnosis not present

## 2020-03-04 DIAGNOSIS — M6281 Muscle weakness (generalized): Secondary | ICD-10-CM

## 2020-03-04 DIAGNOSIS — M545 Low back pain, unspecified: Secondary | ICD-10-CM

## 2020-03-04 DIAGNOSIS — R262 Difficulty in walking, not elsewhere classified: Secondary | ICD-10-CM

## 2020-03-04 NOTE — Therapy (Signed)
[facility] Physical Therapy [address] [place], [place], [address] Phone: [phone]   Fax:  [phone]  Physical Therapy Treatment  Patient Details  Name: Billy Tate MRN: [id number] Date of Birth: [birth year] Referring Provider (PT): Gershon Mussel MD   Encounter Date: 03/04/2020   PT End of Session - 03/04/20 1436    Visit Number 2    Number of Visits 12    Date for PT Re-Evaluation 04/24/20    PT Start Time 1346    PT Stop Time 1430    PT Time Calculation (min) 44 min    Activity Tolerance Patient tolerated treatment well    Behavior During Therapy [facility] for tasks assessed/performed           Past Medical History:  Diagnosis Date  . Dyslipidemia   . Hemorrhoids     Past Surgical History:  Procedure Laterality Date  . APPENDECTOMY  1993    There were no vitals filed for this visit.   Subjective Assessment - 03/04/20 1352    Subjective Patient feels his pain is about the same about 4/10. The exercises at home have been going well with no problems.    Pertinent History PVC's, hyperlipidemia    Limitations Sitting;Lifting;Walking;Standing    How long can you sit comfortably? it depends on where i'm sitting, harder chair about 30 minutes    How long can you stand comfortably? 45 mins    How long can you walk comfortably?    Patient Stated Goals Return to work without pain    Currently in Pain? Yes    Pain Score 4     Pain Location Back    Pain Orientation Right    Pain Descriptors / Indicators Aching    Pain Onset More than a month ago               [facility] Adult PT Treatment/Exercise - 03/04/20 0001      Therapeutic Activites    Therapeutic Activities Lifting    Lifting 5lb kettlebell to chair, 10lb box to chair   corrected body mechanics to ensure ifting with legs not straining back with work     Exercises   Exercises Lumbar;Knee/Hip      Lumbar Exercises: Stretches   Single Knee to Chest Stretch 2 reps;20  seconds;Left;Right    Lower Trunk Rotation 5 reps;10 seconds      Lumbar Exercises: Aerobic   Nustep L4 8 mins UE/LE      Lumbar Exercises: Seated   Other Seated Lumbar Exercises stability ball rollouts 15x 5 sec hold      Lumbar Exercises: Supine   Bent Knee Raise 15 reps;2 seconds   focus on lumbar stabilization cues with hands under back   Bridge 20 reps;Compliant;3 seconds    Other Supine Lumbar Exercises knee to chest with feet on stability ball 15x 3 sec hold      Manual Therapy   Manual Therapy Joint mobilization    Joint Mobilization L2-L5 CPAs grade 2-3                    PT Short Term Goals - 03/02/20 1135      PT SHORT TERM GOAL #1   Title Pt will be independent in his initial HEP.    Time 3    Period Weeks    Status New    Target Date 03/23/20             PT Long Term Goals -  03/02/20 1743      PT LONG TERM GOAL #1   Title Pt will improve his FOTO score from 47 to >/= 65.    Time 6    Period Weeks    Target Date 04/24/20      PT LONG TERM GOAL #2   Title Pt will be independent in his HEP and progress to community based exercise program.    Time 6    Period Weeks    Status New    Target Date 04/24/20      PT LONG TERM GOAL #3   Title Pt will improve his R LE strength to 5/5 for improved walking and funcitonal mobility.    Time 6    Period Weeks    Status New      PT LONG TERM GOAL #4   Title Pt will be able to lift 25# from floor to counter height using proper body mechanics and no pain reported.    Time 6    Period Weeks    Status New    Target Date 04/24/20                 Plan - 03/04/20 1439    Clinical Impression Statement Tried flexion based approach for releif of symptoms and patient tolerated well with no increased pain.Patient demonstrated his lifting technique that showed forward flexed posture, corrected for better posture and cues for the use of bending the legs to take pressure off of the back with light weight.  Patient can benefit from skilled PT to slowly improve back pain symptoms and introduce more functional low load lifting with proper body mechanics to prepare patient for return to work in the coming weeks.    Examination-Activity Limitations Squat;Lift;Carry    Examination-Participation Restrictions Other;Community Activity    Stability/Clinical Decision Making Evolving/Moderate complexity    Rehab Potential Good    PT Frequency 2x / week    PT Duration 6 weeks    PT Treatment/Interventions Cryotherapy;Electrical Stimulation;Iontophoresis 4mg /ml Dexamethasone;Moist Heat;Traction;Ultrasound;Gait training;Stair training;Functional mobility training;Therapeutic activities;Patient/family education;Neuromuscular re-education;Balance training;Manual techniques;Dry needling;Passive range of motion;Taping;Therapeutic exercise    PT Next Visit Plan continue spinal mobs, check change of sx from flexion regimen, kettlebell lifts from floor to chair 5lbs    PT Home Exercise Plan Access Code:  URL: [email or web address]  Date: 03/02/2020  Prepared by: 03/04/2020    Exercises  Supine Lower Trunk Rotation - 2 x daily - 7 x weekly - 5 reps - 20 seconds hold  Supine Bridge - 2 x daily - 7 x weekly - 2 sets - 10 reps - 5 seconds hold  Supine Piriformis Stretch with Foot on Ground - 2 x daily - 7 x weekly - 5 reps - 20 seconds hold  Cat-Camel - 2 x daily - 7 x weekly - 10 reps - 10 seconds hold    Consulted and Agree with Plan of Care Patient           Patient will benefit from skilled therapeutic intervention in order to improve the following deficits and impairments:  Pain,Impaired flexibility,Obesity,Difficulty walking,Decreased activity tolerance,Decreased range of motion,Decreased strength,Postural dysfunction,Decreased mobility  Visit Diagnosis: Chronic right-sided low back pain without sciatica  Difficulty in walking, not elsewhere classified  Muscle weakness  (generalized)     Problem List Patient Active Problem List   Diagnosis Date Noted  . Right leg pain 11/25/2019  . Acute right-sided low back pain with right-sided sciatica 11/25/2019  . Sciatica of right  side 11/25/2019  . Family history of heart disease 10/13/2017  . PVC's (premature ventricular contractions) 12/21/2012  . Hyperlipidemia with target LDL less than 100 10/08/2010  . Obesity (BMI 30-39.9) 10/08/2010    Ritta Slot, SPT 03/04/2020, 2:45 PM   During this treatment session, this physical therapist was present, participating in and directing the treatment.   This note has been reviewed and this clinician agrees with the information provided.  Ivery Quale, PT, DPT 03/04/20 3:47 PM   [facility] Physical Therapy [address] [place], [place], [address] Phone: [phone]   Fax:  [phone]  Name: Billy Tate MRN: [id number] Date of Birth: 05-18-1965

## 2020-03-10 ENCOUNTER — Encounter: Payer: Self-pay | Admitting: Physical Therapy

## 2020-03-10 ENCOUNTER — Other Ambulatory Visit: Payer: Self-pay

## 2020-03-10 ENCOUNTER — Ambulatory Visit (INDEPENDENT_AMBULATORY_CARE_PROVIDER_SITE_OTHER): Payer: Commercial Managed Care - PPO | Admitting: Physical Therapy

## 2020-03-10 DIAGNOSIS — R262 Difficulty in walking, not elsewhere classified: Secondary | ICD-10-CM | POA: Diagnosis not present

## 2020-03-10 DIAGNOSIS — G8929 Other chronic pain: Secondary | ICD-10-CM

## 2020-03-10 DIAGNOSIS — M6281 Muscle weakness (generalized): Secondary | ICD-10-CM

## 2020-03-10 DIAGNOSIS — M545 Low back pain, unspecified: Secondary | ICD-10-CM

## 2020-03-10 NOTE — Therapy (Signed)
[facility] Physical Therapy [address] [place], [place], [address] Phone: [phone]   Fax:  [phone]  Physical Therapy Treatment  Patient Details  Name: Billy Tate MRN: [id number] Date of Birth: Jul 27, 1965 Referring Provider (PT): Gershon Mussel MD   Encounter Date: 03/10/2020   PT End of Session - 03/10/20 1442    Visit Number 3    Number of Visits 12    Date for PT Re-Evaluation 04/24/20    PT Start Time 1438    PT Stop Time 1515    PT Time Calculation (min) 37 min    Activity Tolerance Patient tolerated treatment well    Behavior During Therapy [facility] for tasks assessed/performed           Past Medical History:  Diagnosis Date  . Dyslipidemia   . Hemorrhoids     Past Surgical History:  Procedure Laterality Date  . APPENDECTOMY  1993    There were no vitals filed for this visit.   Subjective Assessment - 03/10/20 1441    Subjective Pt arriving 8 minutes late today reporting feeling a little stiff and 3-4/10 pain. Pt reporting compliance with his HEP.    Limitations Sitting;Lifting;Walking;Standing    How long can you sit comfortably? it depends on where i'm sitting, harder chair about 30 minutes    Patient Stated Goals Return to work without pain    Currently in Pain? Yes    Pain Score 4     Pain Location Back    Pain Orientation Right    Pain Descriptors / Indicators Aching;Tightness    Pain Type Acute pain    Pain Onset More than a month ago                             [facility] Adult PT Treatment/Exercise - 03/10/20 0001      Exercises   Exercises Lumbar;Knee/Hip      Lumbar Exercises: Stretches   Single Knee to Chest Stretch 2 reps;20 seconds;Left;Right    Lower Trunk Rotation 5 reps;10 seconds    Piriformis Stretch 3 reps;20 seconds    Figure 4 Stretch Limitations 3 reps holding 20 seconds      Lumbar Exercises: Aerobic   Nustep L4 x 8 minutes      Lumbar Exercises: Seated   Other Seated Lumbar Exercises  stability ball rollouts 15x 5 sec hold      Lumbar Exercises: Supine   Bent Knee Raise 15 reps;2 seconds   focus on lumbar stabilization cues with hands under back   Dead Bug 20 reps    Bridge 20 reps;Compliant;3 seconds      Manual Therapy   Manual Therapy Joint mobilization    Manual therapy comments percussor to lumbar paraspinals    Joint Mobilization L2-L5 PA mobs grade 2-3                    PT Short Term Goals - 03/10/20 1443      PT SHORT TERM GOAL #1   Title Pt will be independent in his initial HEP.    Status On-going             PT Long Term Goals - 03/10/20 1444      PT LONG TERM GOAL #1   Title Pt will improve his FOTO score from 47 to >/= 65.    Status On-going      PT LONG TERM GOAL #2  Title Pt will be independent in his HEP and progress to community based exercise program.    Status On-going      PT LONG TERM GOAL #3   Title Pt will improve his R LE strength to 5/5 for improved walking and funcitonal mobility.    Status On-going      PT LONG TERM GOAL #4   Title Pt will be able to lift 25# from floor to counter height using proper body mechanics and no pain reported.    Status On-going                 Plan - 03/10/20 1504    Clinical Impression Statement Pt tolerating flexion based program with no reports of increased pain today. Progressing with core stabilization during all exercises. Pt reporting mild discomfort with extension on the left side. Pt reporting feeling less stiff at end of session. Continue with skilled PT.    Examination-Activity Limitations Squat;Lift;Carry    Stability/Clinical Decision Making Evolving/Moderate complexity    Rehab Potential Good    PT Frequency 2x / week    PT Duration 6 weeks    PT Treatment/Interventions Cryotherapy;Electrical Stimulation;Iontophoresis 4mg /ml Dexamethasone;Moist Heat;Traction;Ultrasound;Gait training;Stair training;Functional mobility training;Therapeutic  activities;Patient/family education;Neuromuscular re-education;Balance training;Manual techniques;Dry needling;Passive range of motion;Taping;Therapeutic exercise    PT Next Visit Plan continue spinal mobs, check change of sx from flexion regimen, kettlebell lifts from floor to chair 5lbs    PT Home Exercise Plan Access Code:  URL: [email or web address]  Date: 03/02/2020  Prepared by: 03/04/2020    Exercises  Supine Lower Trunk Rotation - 2 x daily - 7 x weekly - 5 reps - 20 seconds hold  Supine Bridge - 2 x daily - 7 x weekly - 2 sets - 10 reps - 5 seconds hold  Supine Piriformis Stretch with Foot on Ground - 2 x daily - 7 x weekly - 5 reps - 20 seconds hold  Cat-Camel - 2 x daily - 7 x weekly - 10 reps - 10 seconds hold    Consulted and Agree with Plan of Care Patient           Patient will benefit from skilled therapeutic intervention in order to improve the following deficits and impairments:  Pain,Impaired flexibility,Obesity,Difficulty walking,Decreased activity tolerance,Decreased range of motion,Decreased strength,Postural dysfunction,Decreased mobility  Visit Diagnosis: Chronic right-sided low back pain without sciatica  Difficulty in walking, not elsewhere classified  Muscle weakness (generalized)     Problem List Patient Active Problem List   Diagnosis Date Noted  . Right leg pain 11/25/2019  . Acute right-sided low back pain with right-sided sciatica 11/25/2019  . Sciatica of right side 11/25/2019  . Family history of heart disease 10/13/2017  . PVC's (premature ventricular contractions) 12/21/2012  . Hyperlipidemia with target LDL less than 100 10/08/2010  . Obesity (BMI 30-39.9) 10/08/2010    10/10/2010, PT, MPT 03/10/2020, 3:21 PM  [facility] Physical Therapy [address] [place], [place], [place] Phone: [phone]   Fax:  [phone]  Name: Billy Tate MRN: [name] Date of Birth: October 27, 1965

## 2020-03-16 ENCOUNTER — Ambulatory Visit: Payer: Commercial Managed Care - PPO | Admitting: Physical Therapy

## 2020-03-16 ENCOUNTER — Other Ambulatory Visit: Payer: Self-pay

## 2020-03-16 DIAGNOSIS — R262 Difficulty in walking, not elsewhere classified: Secondary | ICD-10-CM | POA: Diagnosis not present

## 2020-03-16 DIAGNOSIS — G8929 Other chronic pain: Secondary | ICD-10-CM | POA: Diagnosis not present

## 2020-03-16 DIAGNOSIS — M6281 Muscle weakness (generalized): Secondary | ICD-10-CM | POA: Diagnosis not present

## 2020-03-16 DIAGNOSIS — M545 Low back pain, unspecified: Secondary | ICD-10-CM

## 2020-03-16 NOTE — Therapy (Signed)
[facility] Physical Therapy [address] [place], [place], [address] Phone: [phone]   Fax:  [phone]  Physical Therapy Treatment  Patient Details  Name: Billy Tate MRN: [id number] Date of Birth: 05-29-1965 Referring Provider (PT): Gershon Mussel MD   Encounter Date: 03/16/2020   PT End of Session - 03/16/20 1348    Visit Number 4    Number of Visits 12    Date for PT Re-Evaluation 04/24/20    PT Start Time 1307    PT Stop Time 1355   last 10 min on heat   PT Time Calculation (min) 48 min    Activity Tolerance Patient tolerated treatment well    Behavior During Therapy [facility] for tasks assessed/performed           Past Medical History:  Diagnosis Date  . Dyslipidemia   . Hemorrhoids     Past Surgical History:  Procedure Laterality Date  . APPENDECTOMY  1993    There were no vitals filed for this visit.   Subjective Assessment - 03/16/20 1348    Subjective Pt states he has some pain and stiffness in his low back in the center with some muscular pain out to the sides.    Pertinent History PVC's, hyperlipidemia    Limitations Sitting;Lifting;Walking;Standing    How long can you sit comfortably? it depends on where i'm sitting, harder chair about 30 minutes    How long can you stand comfortably? 45 mins    How long can you walk comfortably?    Patient Stated Goals Return to work without pain    Pain Onset More than a month ago             [facility] Adult PT Treatment/Exercise - 03/16/20 0001      Lumbar Exercises: Stretches   Single Knee to Chest Stretch 2 reps;30 seconds;Right;Left    Lower Trunk Rotation 5 reps;10 seconds    Other Lumbar Stretch Exercise standing L stretch for flexion 10 sec X 10      Lumbar Exercises: Aerobic   Recumbent Bike L2 X 7 min      Lumbar Exercises: Machines for Strengthening   Other Lumbar Machine Exercise lat pulldown machine 25 lbs 2 sets of 10      Lumbar Exercises: Standing   Lifting Weights  (lbs) 10    Lifting Limitations KB from floor to waist, with cues and demo for proper technique    Other Standing Lumbar Exercises TRX rows and squats 2 sets of 10 ea      Lumbar Exercises: Quadruped   Madcat/Old Horse 10 reps    Opposite Arm/Leg Raise 10 reps      Modalities   Modalities Moist Heat      Moist Heat Therapy   Number Minutes Moist Heat 10 Minutes    Moist Heat Location Lumbar Spine      Manual Therapy   Joint Mobilization L2-L5 PA mobs grade 3, STM to lumbar P.S.                    PT Short Term Goals - 03/10/20 1443      PT SHORT TERM GOAL #1   Title Pt will be independent in his initial HEP.    Status On-going             PT Long Term Goals - 03/10/20 1444      PT LONG TERM GOAL #1   Title Pt will improve his FOTO score  from 47 to >/= 65.    Status On-going      PT LONG TERM GOAL #2   Title Pt will be independent in his HEP and progress to community based exercise program.    Status On-going      PT LONG TERM GOAL #3   Title Pt will improve his R LE strength to 5/5 for improved walking and funcitonal mobility.    Status On-going      PT LONG TERM GOAL #4   Title Pt will be able to lift 25# from floor to counter height using proper body mechanics and no pain reported.    Status On-going                 Plan - 03/16/20 1349    Clinical Impression Statement He was able to show good overall tolerance to strength progression today and core stabilization. He does continue to show direction preference for flexion vs extension movments.    Examination-Activity Limitations Squat;Lift;Carry    Stability/Clinical Decision Making Evolving/Moderate complexity    Rehab Potential Good    PT Frequency 2x / week    PT Duration 6 weeks    PT Treatment/Interventions Cryotherapy;Electrical Stimulation;Iontophoresis 4mg /ml Dexamethasone;Moist Heat;Traction;Ultrasound;Gait training;Stair training;Functional mobility training;Therapeutic  activities;Patient/family education;Neuromuscular re-education;Balance training;Manual techniques;Dry needling;Passive range of motion;Taping;Therapeutic exercise    PT Next Visit Plan continue spinal mobs, strength and lifting kettlebell lifting progression as tolerated    PT Home Exercise Plan Access Code:  URL: [email or web address]  Date: 03/02/2020  Prepared by: 03/04/2020    Exercises  Supine Lower Trunk Rotation - 2 x daily - 7 x weekly - 5 reps - 20 seconds hold  Supine Bridge - 2 x daily - 7 x weekly - 2 sets - 10 reps - 5 seconds hold  Supine Piriformis Stretch with Foot on Ground - 2 x daily - 7 x weekly - 5 reps - 20 seconds hold  Cat-Camel - 2 x daily - 7 x weekly - 10 reps - 10 seconds hold    Consulted and Agree with Plan of Care Patient           Patient will benefit from skilled therapeutic intervention in order to improve the following deficits and impairments:  Pain,Impaired flexibility,Obesity,Difficulty walking,Decreased activity tolerance,Decreased range of motion,Decreased strength,Postural dysfunction,Decreased mobility  Visit Diagnosis: Chronic right-sided low back pain without sciatica  Difficulty in walking, not elsewhere classified  Muscle weakness (generalized)     Problem List Patient Active Problem List   Diagnosis Date Noted  . Right leg pain 11/25/2019  . Acute right-sided low back pain with right-sided sciatica 11/25/2019  . Sciatica of right side 11/25/2019  . Family history of heart disease 10/13/2017  . PVC's (premature ventricular contractions) 12/21/2012  . Hyperlipidemia with target LDL less than 100 10/08/2010  . Obesity (BMI 30-39.9) 10/08/2010    10/10/2010, PT,DPT 03/16/2020, 1:52 PM  [facility] Physical Therapy [address] [place], [place], [place] Phone: [phone]   Fax:  [phone]  Name: Billy Tate MRN: [name] Date of Birth: August 02, 1965

## 2020-03-18 ENCOUNTER — Ambulatory Visit: Payer: Commercial Managed Care - PPO | Admitting: Physical Therapy

## 2020-03-18 ENCOUNTER — Other Ambulatory Visit: Payer: Self-pay

## 2020-03-18 DIAGNOSIS — M545 Low back pain, unspecified: Secondary | ICD-10-CM

## 2020-03-18 DIAGNOSIS — M6281 Muscle weakness (generalized): Secondary | ICD-10-CM | POA: Diagnosis not present

## 2020-03-18 DIAGNOSIS — R262 Difficulty in walking, not elsewhere classified: Secondary | ICD-10-CM

## 2020-03-18 DIAGNOSIS — G8929 Other chronic pain: Secondary | ICD-10-CM | POA: Diagnosis not present

## 2020-03-18 NOTE — Therapy (Signed)
[facility] Physical Therapy [address] [place], [place], [address] Phone: [phone]   Fax:  [phone]  Physical Therapy Treatment  Patient Details  Name: Billy Tate MRN: [id number] Date of Birth: 1965/12/22 Referring Provider (PT): Billy Mussel MD   Encounter Date: 03/18/2020   PT End of Session - 03/18/20 1333    Visit Number 5    Number of Visits 12    Date for PT Re-Evaluation 04/24/20    PT Start Time 1304    PT Stop Time 1349    PT Time Calculation (min) 45 min    Activity Tolerance Patient tolerated treatment well    Behavior During Therapy [facility] for tasks assessed/performed           Past Medical History:  Diagnosis Date  . Dyslipidemia   . Hemorrhoids     Past Surgical History:  Procedure Laterality Date  . APPENDECTOMY  1993    There were no vitals filed for this visit.   Subjective Assessment - 03/18/20 1310    Subjective Pt states he is feeling much better from last session, only mild pain overall.    Pertinent History PVC's, hyperlipidemia    Limitations Sitting;Lifting;Walking;Standing    How long can you sit comfortably? it depends on where i'm sitting, harder chair about 30 minutes    How long can you stand comfortably? 45 mins    How long can you walk comfortably?    Patient Stated Goals Return to work without pain    Pain Onset More than a month ago            [facility] Adult PT Treatment/Exercise - 03/18/20 0001      Lumbar Exercises: Stretches   Single Knee to Chest Stretch 2 reps;30 seconds;Right;Left    Lower Trunk Rotation 5 reps;10 seconds    Quadruped Mid Back Stretch 20 seconds;3 reps      Lumbar Exercises: Machines for Strengthening   Other Lumbar Machine Exercise lat pulldown machine 25 lbs 2 sets of 15      Lumbar Exercises: Standing   Lifting Weights (lbs) 15    Lifting Limitations KB from floor to waist, with cues and demo for proper technique X 10 reps    Other Standing Lumbar Exercises TRX  rows and squats 2 sets of 10 ea    Other Standing Lumbar Exercises suitcase carry 15 lbs one lap ea side, antirotation walkouts green band X 10 bilat      Lumbar Exercises: Supine   Single Leg Bridge 15 reps;3 seconds      Lumbar Exercises: Quadruped   Madcat/Old Horse 10 reps    Opposite Arm/Leg Raise 10 reps      Moist Heat Therapy   Number Minutes Moist Heat 7 Minutes    Moist Heat Location Lumbar Spine                    PT Short Term Goals - 03/10/20 1443      PT SHORT TERM GOAL #1   Title Pt will be independent in his initial HEP.    Status On-going             PT Long Term Goals - 03/10/20 1444      PT LONG TERM GOAL #1   Title Pt will improve his FOTO score from 47 to >/= 65.    Status On-going      PT LONG TERM GOAL #2   Title Pt will be independent in  his HEP and progress to community based exercise program.    Status On-going      PT LONG TERM GOAL #3   Title Pt will improve his R LE strength to 5/5 for improved walking and funcitonal mobility.    Status On-going      PT LONG TERM GOAL #4   Title Pt will be able to lift 25# from floor to counter height using proper body mechanics and no pain reported.    Status On-going                 Plan - 03/18/20 1334    Clinical Impression Statement He had less overall back pain today and was able to tolerate functional strength progression. PT will continue to progress this as able.    Examination-Activity Limitations Squat;Lift;Carry    Stability/Clinical Decision Making Evolving/Moderate complexity    Rehab Potential Good    PT Frequency 2x / week    PT Duration 6 weeks    PT Treatment/Interventions Cryotherapy;Electrical Stimulation;Iontophoresis 4mg /ml Dexamethasone;Moist Heat;Traction;Ultrasound;Gait training;Stair training;Functional mobility training;Therapeutic activities;Patient/family education;Neuromuscular re-education;Balance training;Manual techniques;Dry needling;Passive range of  motion;Taping;Therapeutic exercise    PT Next Visit Plan continue spinal mobs, strength and lifting kettlebell lifting progression as tolerated    PT Home Exercise Plan Access Code:  URL: [email or web address]  Date: 03/02/2020  Prepared by: 03/04/2020    Exercises  Supine Lower Trunk Rotation - 2 x daily - 7 x weekly - 5 reps - 20 seconds hold  Supine Bridge - 2 x daily - 7 x weekly - 2 sets - 10 reps - 5 seconds hold  Supine Piriformis Stretch with Foot on Ground - 2 x daily - 7 x weekly - 5 reps - 20 seconds hold  Cat-Camel - 2 x daily - 7 x weekly - 10 reps - 10 seconds hold    Consulted and Agree with Plan of Care Patient           Patient will benefit from skilled therapeutic intervention in order to improve the following deficits and impairments:  Pain,Impaired flexibility,Obesity,Difficulty walking,Decreased activity tolerance,Decreased range of motion,Decreased strength,Postural dysfunction,Decreased mobility  Visit Diagnosis: Chronic right-sided low back pain without sciatica  Difficulty in walking, not elsewhere classified  Muscle weakness (generalized)     Problem List Patient Active Problem List   Diagnosis Date Noted  . Right leg pain 11/25/2019  . Acute right-sided low back pain with right-sided sciatica 11/25/2019  . Sciatica of right side 11/25/2019  . Family history of heart disease 10/13/2017  . PVC's (premature ventricular contractions) 12/21/2012  . Hyperlipidemia with target LDL less than 100 10/08/2010  . Obesity (BMI 30-39.9) 10/08/2010    10/10/2010, PT,DPT 03/18/2020, 1:43 PM  [facility] Physical Therapy [address] [place], [place], [place] Phone: [phone]   Fax:  [phone]  Name: Billy Tate MRN: [name] Date of Birth: May 16, 1965

## 2020-03-23 ENCOUNTER — Ambulatory Visit: Payer: Commercial Managed Care - PPO | Admitting: Physical Therapy

## 2020-03-23 ENCOUNTER — Encounter: Payer: Self-pay | Admitting: Physical Therapy

## 2020-03-23 ENCOUNTER — Other Ambulatory Visit: Payer: Self-pay

## 2020-03-23 DIAGNOSIS — R262 Difficulty in walking, not elsewhere classified: Secondary | ICD-10-CM | POA: Diagnosis not present

## 2020-03-23 DIAGNOSIS — G8929 Other chronic pain: Secondary | ICD-10-CM | POA: Diagnosis not present

## 2020-03-23 DIAGNOSIS — M6281 Muscle weakness (generalized): Secondary | ICD-10-CM

## 2020-03-23 DIAGNOSIS — M545 Low back pain, unspecified: Secondary | ICD-10-CM

## 2020-03-23 NOTE — Therapy (Signed)
[facility] Physical Therapy [address] [place], [place], [address] Phone: [phone]   Fax:  [phone]  Physical Therapy Treatment  Patient Details  Name: Billy Tate MRN: [id number] Date of Birth: 08-Aug-1965 Referring Provider (PT): Gershon Mussel MD   Encounter Date: 03/23/2020   PT End of Session - 03/23/20 1334    Visit Number 6    Number of Visits 12    Date for PT Re-Evaluation 04/24/20    PT Start Time 1301    PT Stop Time 1346    PT Time Calculation (min) 45 min    Activity Tolerance Patient tolerated treatment well    Behavior During Therapy [facility] for tasks assessed/performed           Past Medical History:  Diagnosis Date  . Dyslipidemia   . Hemorrhoids     Past Surgical History:  Procedure Laterality Date  . APPENDECTOMY  1993    There were no vitals filed for this visit.   Subjective Assessment - 03/23/20 1321    Subjective today he relays he has no pain at rest and only mild pain with certain activities    Pertinent History PVC's, hyperlipidemia    Limitations Sitting;Lifting;Walking;Standing    How long can you sit comfortably? it depends on where i'm sitting, harder chair about 30 minutes    How long can you stand comfortably? 45 mins    How long can you walk comfortably?    Patient Stated Goals Return to work without pain    Pain Onset More than a month ago              [facility] PT Assessment - 03/23/20 0001      AROM   Lumbar Flexion WNL    Lumbar Extension 75%    Lumbar - Right Side Bend WNL    Lumbar - Left Side Bend WNL    Lumbar - Right Rotation WNL    Lumbar - Left Rotation WNL      Strength   Overall Strength Comments hip strength grossly 4+ bilat, knee strength 5/5 bilat                         [facility] Adult PT Treatment/Exercise - 03/23/20 0001      Lumbar Exercises: Stretches   Double Knee to Chest Stretch 10 seconds    Double Knee to Chest Stretch Limitations 10 reps with feet on  pball    Lower Trunk Rotation 5 reps;10 seconds    Other Lumbar Stretch Exercise setaed pball roll outs 10 sec X 10 into flexion      Lumbar Exercises: Standing   Lifting Weights (lbs) 20    Lifting Limitations 10 reps    Other Standing Lumbar Exercises TRX rows and squats 2 sets of 10 ea    Other Standing Lumbar Exercises suitcase carry 20 lbs one lap ea side, antirotation walkouts blue band X 10 bilat      Lumbar Exercises: Seated   Other Seated Lumbar Exercises rows and extensions blue 2X15 ea sitting on pball      Lumbar Exercises: Supine   Bridge 10 reps    Bridge Limitations 2 sets with feet on pball      Lumbar Exercises: Quadruped   Madcat/Old Horse 10 reps    Opposite Arm/Leg Raise 10 reps                    PT Short Term Goals -  03/10/20 1443      PT SHORT TERM GOAL #1   Title Pt will be independent in his initial HEP.    Status On-going             PT Long Term Goals - 03/10/20 1444      PT LONG TERM GOAL #1   Title Pt will improve his FOTO score from 47 to >/= 65.    Status On-going      PT LONG TERM GOAL #2   Title Pt will be independent in his HEP and progress to community based exercise program.    Status On-going      PT LONG TERM GOAL #3   Title Pt will improve his R LE strength to 5/5 for improved walking and funcitonal mobility.    Status On-going      PT LONG TERM GOAL #4   Title Pt will be able to lift 25# from floor to counter height using proper body mechanics and no pain reported.    Status On-going                 Plan - 03/23/20 1335    Clinical Impression Statement He had good tolerance to functional strength progression today. He has overall made good progress with lumbar ROM and overall strength and will continue to benefit from graded progresison with PT.    Examination-Activity Limitations Squat;Lift;Carry    Stability/Clinical Decision Making Evolving/Moderate complexity    Rehab Potential Good    PT Frequency  2x / week    PT Duration 6 weeks    PT Treatment/Interventions Cryotherapy;Electrical Stimulation;Iontophoresis 4mg /ml Dexamethasone;Moist Heat;Traction;Ultrasound;Gait training;Stair training;Functional mobility training;Therapeutic activities;Patient/family education;Neuromuscular re-education;Balance training;Manual techniques;Dry needling;Passive range of motion;Taping;Therapeutic exercise    PT Next Visit Plan continue spinal mobs, strength and lifting kettlebell lifting progression as tolerated    PT Home Exercise Plan Access Code:  URL: [email or web address]  Date: 03/02/2020  Prepared by: 03/04/2020    Exercises  Supine Lower Trunk Rotation - 2 x daily - 7 x weekly - 5 reps - 20 seconds hold  Supine Bridge - 2 x daily - 7 x weekly - 2 sets - 10 reps - 5 seconds hold  Supine Piriformis Stretch with Foot on Ground - 2 x daily - 7 x weekly - 5 reps - 20 seconds hold  Cat-Camel - 2 x daily - 7 x weekly - 10 reps - 10 seconds hold    Consulted and Agree with Plan of Care Patient           Patient will benefit from skilled therapeutic intervention in order to improve the following deficits and impairments:  Pain,Impaired flexibility,Obesity,Difficulty walking,Decreased activity tolerance,Decreased range of motion,Decreased strength,Postural dysfunction,Decreased mobility  Visit Diagnosis: Chronic right-sided low back pain without sciatica  Difficulty in walking, not elsewhere classified  Muscle weakness (generalized)     Problem List Patient Active Problem List   Diagnosis Date Noted  . Right leg pain 11/25/2019  . Acute right-sided low back pain with right-sided sciatica 11/25/2019  . Sciatica of right side 11/25/2019  . Family history of heart disease 10/13/2017  . PVC's (premature ventricular contractions) 12/21/2012  . Hyperlipidemia with target LDL less than 100 10/08/2010  . Obesity (BMI 30-39.9) 10/08/2010    10/10/2010 03/23/2020,  1:51 PM  [facility] Physical Therapy [address] [place], [place], [place] Phone: [phone]   Fax:  [phone]  Name: Billy Tate MRN: [name] Date of Birth: 10-Jun-1965

## 2020-03-25 ENCOUNTER — Other Ambulatory Visit: Payer: Self-pay

## 2020-03-25 ENCOUNTER — Ambulatory Visit: Payer: Commercial Managed Care - PPO | Admitting: Physical Therapy

## 2020-03-25 DIAGNOSIS — R262 Difficulty in walking, not elsewhere classified: Secondary | ICD-10-CM

## 2020-03-25 DIAGNOSIS — M6281 Muscle weakness (generalized): Secondary | ICD-10-CM

## 2020-03-25 DIAGNOSIS — M545 Low back pain, unspecified: Secondary | ICD-10-CM

## 2020-03-25 DIAGNOSIS — G8929 Other chronic pain: Secondary | ICD-10-CM

## 2020-03-25 NOTE — Therapy (Signed)
[facility] Physical Therapy [address] [place], [place], [address] Phone: [phone]   Fax:  [phone]  Physical Therapy Treatment  Patient Details  Name: Billy Tate MRN: [id number] Date of Birth: 04/01/1965 Referring Provider (PT): Gershon Mussel MD   Encounter Date: 03/25/2020   PT End of Session - 03/25/20 1349    Visit Number 7    Number of Visits 12    Date for PT Re-Evaluation 04/24/20    PT Start Time 1307    PT Stop Time 1352    PT Time Calculation (min) 45 min    Activity Tolerance Patient tolerated treatment well    Behavior During Therapy [facility] for tasks assessed/performed           Past Medical History:  Diagnosis Date  . Dyslipidemia   . Hemorrhoids     Past Surgical History:  Procedure Laterality Date  . APPENDECTOMY  1993    There were no vitals filed for this visit.   Subjective Assessment - 03/25/20 1317    Subjective overall less pain but still with back pain with certain activities    Pertinent History PVC's, hyperlipidemia    Limitations Sitting;Lifting;Walking;Standing    How long can you sit comfortably? it depends on where i'm sitting, harder chair about 30 minutes    How long can you stand comfortably? 45 mins    How long can you walk comfortably?    Patient Stated Goals Return to work without pain    Pain Onset More than a month ago            [facility] Adult PT Treatment/Exercise - 03/25/20 0001      Lumbar Exercises: Stretches   Other Lumbar Stretch Exercise setaed pball roll outs 10 sec X 10 into flexion      Lumbar Exercises: Aerobic   Recumbent Bike L3X8 min      Lumbar Exercises: Standing   Lifting Weights (lbs) 20    Lifting Limitations 15 reps    Other Standing Lumbar Exercises cable walk outs fwd and retro 5 X ea using both cables with 20 lbs on ea(40 lbs total)    Other Standing Lumbar Exercises suitcase carry 20 lbs one lap ea side, antirotation walkouts blue band X 10 bilat      Lumbar  Exercises: Seated   Other Seated Lumbar Exercises rows and extensions blue 2X15 ea sitting on pball      Moist Heat Therapy   Number Minutes Moist Heat 7 Minutes    Moist Heat Location Lumbar Spine                    PT Short Term Goals - 03/10/20 1443      PT SHORT TERM GOAL #1   Title Pt will be independent in his initial HEP.    Status On-going             PT Long Term Goals - 03/10/20 1444      PT LONG TERM GOAL #1   Title Pt will improve his FOTO score from 47 to >/= 65.    Status On-going      PT LONG TERM GOAL #2   Title Pt will be independent in his HEP and progress to community based exercise program.    Status On-going      PT LONG TERM GOAL #3   Title Pt will improve his R LE strength to 5/5 for improved walking and funcitonal mobility.  Status On-going      PT LONG TERM GOAL #4   Title Pt will be able to lift 25# from floor to counter height using proper body mechanics and no pain reported.    Status On-going                 Plan - 03/25/20 1401    Clinical Impression Statement Continued with functional strength progresion with fair to good overall tolerance. PT will continue to progress as tolerated.    Examination-Activity Limitations Squat;Lift;Carry    Stability/Clinical Decision Making Evolving/Moderate complexity    Rehab Potential Good    PT Frequency 2x / week    PT Duration 6 weeks    PT Treatment/Interventions Cryotherapy;Electrical Stimulation;Iontophoresis 4mg /ml Dexamethasone;Moist Heat;Traction;Ultrasound;Gait training;Stair training;Functional mobility training;Therapeutic activities;Patient/family education;Neuromuscular re-education;Balance training;Manual techniques;Dry needling;Passive range of motion;Taping;Therapeutic exercise    PT Next Visit Plan continue spinal mobs, strength and lifting kettlebell lifting progression as tolerated    PT Home Exercise Plan Access Code:  URL:  [email or web address]  Date: 03/02/2020  Prepared by: 03/04/2020    Exercises  Supine Lower Trunk Rotation - 2 x daily - 7 x weekly - 5 reps - 20 seconds hold  Supine Bridge - 2 x daily - 7 x weekly - 2 sets - 10 reps - 5 seconds hold  Supine Piriformis Stretch with Foot on Ground - 2 x daily - 7 x weekly - 5 reps - 20 seconds hold  Cat-Camel - 2 x daily - 7 x weekly - 10 reps - 10 seconds hold    Consulted and Agree with Plan of Care Patient           Patient will benefit from skilled therapeutic intervention in order to improve the following deficits and impairments:  Pain,Impaired flexibility,Obesity,Difficulty walking,Decreased activity tolerance,Decreased range of motion,Decreased strength,Postural dysfunction,Decreased mobility  Visit Diagnosis: Chronic right-sided low back pain without sciatica  Difficulty in walking, not elsewhere classified  Muscle weakness (generalized)     Problem List Patient Active Problem List   Diagnosis Date Noted  . Right leg pain 11/25/2019  . Acute right-sided low back pain with right-sided sciatica 11/25/2019  . Sciatica of right side 11/25/2019  . Family history of heart disease 10/13/2017  . PVC's (premature ventricular contractions) 12/21/2012  . Hyperlipidemia with target LDL less than 100 10/08/2010  . Obesity (BMI 30-39.9) 10/08/2010    10/10/2010, PT,DPT 03/25/2020, 2:02 PM  [facility] Physical Therapy [address] [place], [place], [place] Phone: [phone]   Fax:  [phone]  Name: Billy Tate MRN: [name] Date of Birth: July 07, 1965

## 2020-03-30 ENCOUNTER — Other Ambulatory Visit: Payer: Self-pay

## 2020-03-30 ENCOUNTER — Encounter: Payer: Self-pay | Admitting: Physical Therapy

## 2020-03-30 ENCOUNTER — Ambulatory Visit (INDEPENDENT_AMBULATORY_CARE_PROVIDER_SITE_OTHER): Payer: Commercial Managed Care - PPO | Admitting: Physical Therapy

## 2020-03-30 DIAGNOSIS — M6281 Muscle weakness (generalized): Secondary | ICD-10-CM | POA: Diagnosis not present

## 2020-03-30 DIAGNOSIS — M545 Low back pain, unspecified: Secondary | ICD-10-CM

## 2020-03-30 DIAGNOSIS — G8929 Other chronic pain: Secondary | ICD-10-CM

## 2020-03-30 DIAGNOSIS — R262 Difficulty in walking, not elsewhere classified: Secondary | ICD-10-CM

## 2020-03-30 NOTE — Therapy (Signed)
[facility] Physical Therapy [address] [place], [place], [address] Phone: [phone]   Fax:  [phone]  Physical Therapy Treatment  Patient Details  Name: Billy Tate MRN: [id number] Date of Birth: 11-Apr-1965 Referring Provider (PT): Gershon Mussel MD   Encounter Date: 03/30/2020   PT End of Session - 03/30/20 1317    Visit Number 8    Number of Visits 12    Date for PT Re-Evaluation 04/24/20    PT Start Time 1305    PT Stop Time 1344    PT Time Calculation (min) 39 min    Activity Tolerance Patient tolerated treatment well    Behavior During Therapy [facility] for tasks assessed/performed           Past Medical History:  Diagnosis Date  . Dyslipidemia   . Hemorrhoids     Past Surgical History:  Procedure Laterality Date  . APPENDECTOMY  1993    There were no vitals filed for this visit.   Subjective Assessment - 03/30/20 1314    Subjective Pt reporting 4/10 pain in his low back, but pt reporting overall improvements    Pertinent History PVC's, hyperlipidemia    Limitations Sitting;Lifting;Walking;Standing    How long can you sit comfortably? it depends on where i'm sitting, harder chair about 30 minutes    How long can you stand comfortably? 45 mins    How long can you walk comfortably?    Patient Stated Goals Return to work without pain    Currently in Pain? Yes    Pain Score 4     Pain Orientation Lower    Pain Descriptors / Indicators Aching;Tightness    Pain Type Acute pain    Pain Onset More than a month ago                             [facility] Adult PT Treatment/Exercise - 03/30/20 0001      Lumbar Exercises: Stretches   Single Knee to Chest Stretch Left;Right;3 reps;20 seconds    Lower Trunk Rotation 2 reps;20 seconds    Other Lumbar Stretch Exercise setaed pball roll outs 10 sec X 10 into flexion      Lumbar Exercises: Aerobic   Recumbent Bike L3 x 7 minutes      Lumbar Exercises: Standing   Lifting  Weights (lbs) 20    Lifting Limitations 15 reps    Other Standing Lumbar Exercises BATCA: D1 and D2 2 x 10 7 pounds    Other Standing Lumbar Exercises suitcase carry 20# 1 lap around gym using each UE      Lumbar Exercises: Seated   Other Seated Lumbar Exercises rows and extensions blue 2X15 ea sitting on pball      Lumbar Exercises: Supine   Bridge 10 reps    Bridge Limitations feet on green physioball                    PT Short Term Goals - 03/30/20 1329      PT SHORT TERM GOAL #1   Title Pt will be independent in his initial HEP.    Status Achieved             PT Long Term Goals - 03/30/20 1330      PT LONG TERM GOAL #1   Title Pt will improve his FOTO score from 47 to >/= 65.    Status On-going  PT LONG TERM GOAL #2   Title Pt will be independent in his HEP and progress to community based exercise program.    Status On-going      PT LONG TERM GOAL #3   Title Pt will improve his R LE strength to 5/5 for improved walking and funcitonal mobility.    Status On-going      PT LONG TERM GOAL #4   Title Pt will be able to lift 25# from floor to counter height using proper body mechanics and no pain reported.    Status On-going                 Plan - 03/30/20 1326    Clinical Impression Statement Pt arriving to therpay today reporting 4/10 pain in his low back. Pt reporting overall improvements in posture and pain. Pt tolerating exercises well progressing with core strengthening and funcitonal mobility.    Examination-Activity Limitations Squat;Lift;Carry    Examination-Participation Restrictions Other;Community Activity    Stability/Clinical Decision Making Evolving/Moderate complexity    Rehab Potential Good    PT Frequency 2x / week    PT Duration 6 weeks    PT Treatment/Interventions Cryotherapy;Electrical Stimulation;Iontophoresis 4mg /ml Dexamethasone;Moist Heat;Traction;Ultrasound;Gait training;Stair training;Functional mobility  training;Therapeutic activities;Patient/family education;Neuromuscular re-education;Balance training;Manual techniques;Dry needling;Passive range of motion;Taping;Therapeutic exercise    PT Next Visit Plan continue spinal mobs, strength and lifting kettlebell lifting progression as tolerated    PT Home Exercise Plan Access Code:  URL: [email or web address]  Date: 03/02/2020  Prepared by: 03/04/2020    Exercises  Supine Lower Trunk Rotation - 2 x daily - 7 x weekly - 5 reps - 20 seconds hold  Supine Bridge - 2 x daily - 7 x weekly - 2 sets - 10 reps - 5 seconds hold  Supine Piriformis Stretch with Foot on Ground - 2 x daily - 7 x weekly - 5 reps - 20 seconds hold  Cat-Camel - 2 x daily - 7 x weekly - 10 reps - 10 seconds hold    Consulted and Agree with Plan of Care Patient           Patient will benefit from skilled therapeutic intervention in order to improve the following deficits and impairments:  Pain,Impaired flexibility,Obesity,Difficulty walking,Decreased activity tolerance,Decreased range of motion,Decreased strength,Postural dysfunction,Decreased mobility  Visit Diagnosis: Chronic right-sided low back pain without sciatica  Difficulty in walking, not elsewhere classified  Muscle weakness (generalized)     Problem List Patient Active Problem List   Diagnosis Date Noted  . Right leg pain 11/25/2019  . Acute right-sided low back pain with right-sided sciatica 11/25/2019  . Sciatica of right side 11/25/2019  . Family history of heart disease 10/13/2017  . PVC's (premature ventricular contractions) 12/21/2012  . Hyperlipidemia with target LDL less than 100 10/08/2010  . Obesity (BMI 30-39.9) 10/08/2010    10/10/2010, PT, MPT 03/30/2020, 1:39 PM  [facility] Physical Therapy [address] [place], [place], [place] Phone: [phone]   Fax:  [phone]  Name: Billy Tate MRN: [name] Date of Birth: Aug 06, 1965

## 2020-04-06 ENCOUNTER — Other Ambulatory Visit: Payer: Self-pay

## 2020-04-06 ENCOUNTER — Encounter: Payer: Self-pay | Admitting: Physical Therapy

## 2020-04-06 ENCOUNTER — Ambulatory Visit (INDEPENDENT_AMBULATORY_CARE_PROVIDER_SITE_OTHER): Payer: Commercial Managed Care - PPO | Admitting: Physical Therapy

## 2020-04-06 DIAGNOSIS — M545 Low back pain, unspecified: Secondary | ICD-10-CM

## 2020-04-06 DIAGNOSIS — M6281 Muscle weakness (generalized): Secondary | ICD-10-CM

## 2020-04-06 DIAGNOSIS — G8929 Other chronic pain: Secondary | ICD-10-CM

## 2020-04-06 DIAGNOSIS — R262 Difficulty in walking, not elsewhere classified: Secondary | ICD-10-CM | POA: Diagnosis not present

## 2020-04-06 NOTE — Therapy (Signed)
[facility] Physical Therapy [address] [place], [place], [address] Phone: [phone]   Fax:  [phone]  Physical Therapy Treatment  Patient Details  Name: Billy Tate MRN: [id number] Date of Birth: 1965/01/29 Referring Provider (PT): Gershon Mussel MD   Encounter Date: 04/06/2020   PT End of Session - 04/06/20 1310    Visit Number 9    Number of Visits 12    Date for PT Re-Evaluation 04/24/20    PT Start Time 1301    PT Stop Time 1340    PT Time Calculation (min) 39 min    Activity Tolerance Patient tolerated treatment well    Behavior During Therapy [facility] for tasks assessed/performed           Past Medical History:  Diagnosis Date  . Dyslipidemia   . Hemorrhoids     Past Surgical History:  Procedure Laterality Date  . APPENDECTOMY  1993    There were no vitals filed for this visit.   Subjective Assessment - 04/06/20 1309    Subjective Pt arriving reporting 4/10 pain in his left sided low back. Pt feels he over did it over the weekend on Saturday.    Pertinent History PVC's, hyperlipidemia    Limitations Sitting;Lifting;Walking;Standing    How long can you sit comfortably? it depends on where i'm sitting, harder chair about 30 minutes    How long can you stand comfortably? 45 mins    Patient Stated Goals Return to work without pain    Currently in Pain? Yes    Pain Score 4     Pain Location Back    Pain Orientation Left;Lower    Pain Descriptors / Indicators Aching;Sore    Pain Type Acute pain    Pain Onset More than a month ago                             [facility] Adult PT Treatment/Exercise - 04/06/20 0001      Lumbar Exercises: Aerobic   Recumbent Bike L3 x 8 minutes      Lumbar Exercises: Machines for Strengthening   Other Lumbar Machine Exercise Lat pull downs 3x 10 25#      Lumbar Exercises: Standing   Lifting Weights (lbs) 25    Lifting Limitations 10 reps    Other Standing Lumbar Exercises --    Other  Standing Lumbar Exercises golfer lift 10 pounds 2 x 5 repseach UE      Lumbar Exercises: Seated   Other Seated Lumbar Exercises D1 and D2 sitting on green physioball using BATCA 10#    Other Seated Lumbar Exercises BATCA rows 3x10 15#      Lumbar Exercises: Quadruped   Opposite Arm/Leg Raise 10 reps    Plank elbows fully extended x 5 holding 20 seconds each, instructions for form and abdominal tightening      Manual Therapy   Manual therapy comments IASTM to L lumbar paraspinals   8 minutes                   PT Short Term Goals - 04/06/20 1311      PT SHORT TERM GOAL #1   Title Pt will be independent in his initial HEP.    Status Achieved             PT Long Term Goals - 04/06/20 1311      PT LONG TERM GOAL #1   Title Pt will  improve his FOTO score from 47 to >/= 65.    Status On-going      PT LONG TERM GOAL #2   Title Pt will be independent in his HEP and progress to community based exercise program.    Status On-going      PT LONG TERM GOAL #3   Title Pt will improve his R LE strength to 5/5 for improved walking and funcitonal mobility.    Status On-going      PT LONG TERM GOAL #4   Title Pt will be able to lift 25# from floor to counter height using proper body mechanics and no pain reported.    Status On-going                 Plan - 04/06/20 1312    Clinical Impression Statement Pt arriving to therapy reporting 4/10 pain in his left sided low back. Pt reporting he feels like he overworked himself on Saturday. Pt tolerating exercises well progressing toward strengthening and improved functional mobility. We discussed pt only has 3 more visits between now and 04/24/2020. Pt feels he has made progress since beginning therapy but feels a few more visits will help. Pt wants to be able to return to golf and bowling lesiure activities.    Examination-Activity Limitations Squat;Lift;Carry    Examination-Participation Restrictions Other;Community Activity     Stability/Clinical Decision Making Evolving/Moderate complexity    Rehab Potential Good    PT Frequency 2x / week    PT Duration 6 weeks    PT Treatment/Interventions Cryotherapy;Electrical Stimulation;Iontophoresis 4mg /ml Dexamethasone;Moist Heat;Traction;Ultrasound;Gait training;Stair training;Functional mobility training;Therapeutic activities;Patient/family education;Neuromuscular re-education;Balance training;Manual techniques;Dry needling;Passive range of motion;Taping;Therapeutic exercise    PT Next Visit Plan Golf back mobilty, and bowling,  continue spinal mobs, strength and lifting kettlebell lifting progression as tolerated    PT Home Exercise Plan Access Code:  URL: [email or web address]  Date: 03/02/2020  Prepared by: 03/04/2020    Exercises  Supine Lower Trunk Rotation - 2 x daily - 7 x weekly - 5 reps - 20 seconds hold  Supine Bridge - 2 x daily - 7 x weekly - 2 sets - 10 reps - 5 seconds hold  Supine Piriformis Stretch with Foot on Ground - 2 x daily - 7 x weekly - 5 reps - 20 seconds hold  Cat-Camel - 2 x daily - 7 x weekly - 10 reps - 10 seconds hold    Consulted and Agree with Plan of Care Patient           Patient will benefit from skilled therapeutic intervention in order to improve the following deficits and impairments:  Pain,Impaired flexibility,Obesity,Difficulty walking,Decreased activity tolerance,Decreased range of motion,Decreased strength,Postural dysfunction,Decreased mobility  Visit Diagnosis: Chronic right-sided low back pain without sciatica  Difficulty in walking, not elsewhere classified  Muscle weakness (generalized)     Problem List Patient Active Problem List   Diagnosis Date Noted  . Right leg pain 11/25/2019  . Acute right-sided low back pain with right-sided sciatica 11/25/2019  . Sciatica of right side 11/25/2019  . Family history of heart disease 10/13/2017  . PVC's (premature ventricular contractions)  12/21/2012  . Hyperlipidemia with target LDL less than 100 10/08/2010  . Obesity (BMI 30-39.9) 10/08/2010    10/10/2010, PT, MPT 04/06/2020, 2:00 PM  [facility] Physical Therapy [address] [place], [place], [place] Phone: [phone]   Fax:  [phone]  Name: Billy Tate MRN: [name] Date of Birth: 1965/12/14

## 2020-04-08 ENCOUNTER — Other Ambulatory Visit: Payer: Self-pay

## 2020-04-08 ENCOUNTER — Encounter: Payer: Self-pay | Admitting: Physical Therapy

## 2020-04-08 ENCOUNTER — Ambulatory Visit (INDEPENDENT_AMBULATORY_CARE_PROVIDER_SITE_OTHER): Payer: Commercial Managed Care - PPO | Admitting: Physical Therapy

## 2020-04-08 DIAGNOSIS — G8929 Other chronic pain: Secondary | ICD-10-CM

## 2020-04-08 DIAGNOSIS — M545 Low back pain, unspecified: Secondary | ICD-10-CM

## 2020-04-08 DIAGNOSIS — M6281 Muscle weakness (generalized): Secondary | ICD-10-CM | POA: Diagnosis not present

## 2020-04-08 DIAGNOSIS — R262 Difficulty in walking, not elsewhere classified: Secondary | ICD-10-CM

## 2020-04-08 NOTE — Therapy (Signed)
[facility] Physical Therapy [address] [place], [place], [address] Phone: [phone]   Fax:  [phone]  Physical Therapy Treatment  Patient Details  Name: Billy Tate MRN: [id number] Date of Birth: 06/20/1965 Referring Provider (PT): Gershon Mussel MD   Encounter Date: 04/08/2020   PT End of Session - 04/08/20 1348    Visit Number 10    Number of Visits 12    Date for PT Re-Evaluation 04/24/20    PT Start Time 1304    PT Stop Time 1344    PT Time Calculation (min) 40 min    Activity Tolerance Patient tolerated treatment well    Behavior During Therapy [facility] for tasks assessed/performed           Past Medical History:  Diagnosis Date  . Dyslipidemia   . Hemorrhoids     Past Surgical History:  Procedure Laterality Date  . APPENDECTOMY  1993    There were no vitals filed for this visit.   Subjective Assessment - 04/08/20 1305    Subjective back is doing pretty well.  improving overall    Pertinent History PVC's, hyperlipidemia    Limitations Sitting;Lifting;Walking;Standing    How long can you sit comfortably? it depends on where i'm sitting, harder chair about 30 minutes    Patient Stated Goals Return to work without pain    Currently in Pain? Yes    Pain Score 3     Pain Location Back    Pain Orientation Left;Lower    Pain Descriptors / Indicators Aching;Sore    Pain Type Acute pain    Pain Onset More than a month ago    Pain Frequency Constant    Aggravating Factors  extension    Pain Relieving Factors aleve, rest, stretching                             [facility] Adult PT Treatment/Exercise - 04/08/20 1307      Lumbar Exercises: Aerobic   Recumbent Bike L5 x 8 min      Lumbar Exercises: Machines for Strengthening   Other Lumbar Machine Exercise Rows 35# 3x10    Other Lumbar Machine Exercise Lat pull downs 3x 10 35#      Lumbar Exercises: Standing   Functional Squats 20 reps    Functional Squats Limitations 12# KB     Other Standing Lumbar Exercises cable walk out 20# each side (40# total) x 10 reps    Other Standing Lumbar Exercises SL deadlift 12# KB 2x10 bil      Lumbar Exercises: Quadruped   Plank high plank 4x20 sec; last rep 25 sec    Other Quadruped Lumbar Exercises superman 5 sec hold 2x10                    PT Short Term Goals - 04/06/20 1311      PT SHORT TERM GOAL #1   Title Pt will be independent in his initial HEP.    Status Achieved             PT Long Term Goals - 04/06/20 1311      PT LONG TERM GOAL #1   Title Pt will improve his FOTO score from 47 to >/= 65.    Status On-going      PT LONG TERM GOAL #2   Title Pt will be independent in his HEP and progress to community based exercise program.  Status On-going      PT LONG TERM GOAL #3   Title Pt will improve his R LE strength to 5/5 for improved walking and funcitonal mobility.    Status On-going      PT LONG TERM GOAL #4   Title Pt will be able to lift 25# from floor to counter height using proper body mechanics and no pain reported.    Status On-going                 Plan - 04/08/20 1348    Clinical Impression Statement Pt tolerated session well today with continued focus on strengthening and core exercises.  Will continue to benefit from PT to maximzie function.  Pain decreasing overall.    Examination-Activity Limitations Squat;Lift;Carry    Examination-Participation Restrictions Other;Community Activity    Stability/Clinical Decision Making Evolving/Moderate complexity    Rehab Potential Good    PT Frequency 2x / week    PT Duration 6 weeks    PT Treatment/Interventions Cryotherapy;Electrical Stimulation;Iontophoresis 4mg /ml Dexamethasone;Moist Heat;Traction;Ultrasound;Gait training;Stair training;Functional mobility training;Therapeutic activities;Patient/family education;Neuromuscular re-education;Balance training;Manual techniques;Dry needling;Passive range of motion;Taping;Therapeutic  exercise    PT Next Visit Plan Golf back mobilty, and bowling,  continue spinal mobs, strength and lifting kettlebell lifting progression as tolerated; may need recert/d/c depending on progress    PT Home Exercise Plan Access Code:  URL: [email or web address]  Date: 03/02/2020  Prepared by: 03/04/2020    Exercises  Supine Lower Trunk Rotation - 2 x daily - 7 x weekly - 5 reps - 20 seconds hold  Supine Bridge - 2 x daily - 7 x weekly - 2 sets - 10 reps - 5 seconds hold  Supine Piriformis Stretch with Foot on Ground - 2 x daily - 7 x weekly - 5 reps - 20 seconds hold  Cat-Camel - 2 x daily - 7 x weekly - 10 reps - 10 seconds hold    Consulted and Agree with Plan of Care Patient           Patient will benefit from skilled therapeutic intervention in order to improve the following deficits and impairments:  Pain,Impaired flexibility,Obesity,Difficulty walking,Decreased activity tolerance,Decreased range of motion,Decreased strength,Postural dysfunction,Decreased mobility  Visit Diagnosis: Chronic right-sided low back pain without sciatica  Difficulty in walking, not elsewhere classified  Muscle weakness (generalized)     Problem List Patient Active Problem List   Diagnosis Date Noted  . Right leg pain 11/25/2019  . Acute right-sided low back pain with right-sided sciatica 11/25/2019  . Sciatica of right side 11/25/2019  . Family history of heart disease 10/13/2017  . PVC's (premature ventricular contractions) 12/21/2012  . Hyperlipidemia with target LDL less than 100 10/08/2010  . Obesity (BMI 30-39.9) 10/08/2010      10/10/2010, PT, DPT 04/08/20 1:50 PM    [facility] Physical Therapy [address] [place], [place], [place] Phone: [phone]   Fax:  [phone]  Name: Helder Crisafulli MRN: [name] Date of Birth: 07-Jan-1966

## 2020-04-13 ENCOUNTER — Other Ambulatory Visit: Payer: Self-pay

## 2020-04-13 ENCOUNTER — Ambulatory Visit: Payer: Commercial Managed Care - PPO | Admitting: Physical Therapy

## 2020-04-13 ENCOUNTER — Encounter: Payer: Self-pay | Admitting: Physical Therapy

## 2020-04-13 DIAGNOSIS — G8929 Other chronic pain: Secondary | ICD-10-CM | POA: Diagnosis not present

## 2020-04-13 DIAGNOSIS — M545 Low back pain, unspecified: Secondary | ICD-10-CM | POA: Diagnosis not present

## 2020-04-13 DIAGNOSIS — R262 Difficulty in walking, not elsewhere classified: Secondary | ICD-10-CM

## 2020-04-13 DIAGNOSIS — M6281 Muscle weakness (generalized): Secondary | ICD-10-CM | POA: Diagnosis not present

## 2020-04-13 NOTE — Therapy (Signed)
[facility] Physical Therapy [address] [place], [place], [address] Phone: [phone]   Fax:  [phone]  Physical Therapy Treatment  Patient Details  Name: Billy Tate MRN: [id number] Date of Birth: [birth year] Referring Provider (PT): Gershon Mussel MD   Encounter Date: 04/13/2020   PT End of Session - 04/13/20 1429    Visit Number 11    Number of Visits 12    Date for PT Re-Evaluation 04/24/20    PT Start Time 1345    PT Stop Time 1428    PT Time Calculation (min) 43 min    Activity Tolerance Patient tolerated treatment well    Behavior During Therapy [facility] for tasks assessed/performed           Past Medical History:  Diagnosis Date  . Dyslipidemia   . Hemorrhoids     Past Surgical History:  Procedure Laterality Date  . APPENDECTOMY  1993    There were no vitals filed for this visit.   Subjective Assessment - 04/13/20 1350    Subjective took a break yesterady and back is doing well today.  a little sore after last session but not bad    Pertinent History PVC's, hyperlipidemia    Limitations Sitting;Lifting;Walking;Standing    How long can you sit comfortably? it depends on where i'm sitting, harder chair about 30 minutes    Patient Stated Goals Return to work without pain    Currently in Pain? Yes    Pain Score 4     Pain Location Back    Pain Orientation Left;Lower    Pain Descriptors / Indicators Aching;Sore    Pain Type Acute pain    Pain Onset More than a month ago    Pain Frequency Constant    Aggravating Factors  extension    Pain Relieving Factors aleve, rest, stretching              [facility] PT Assessment - 04/13/20 1428      Assessment   Medical Diagnosis m54.41 R sided LBP with sciatica    Referring Provider (PT) Gershon Mussel MD      Observation/Other Assessments   Focus on Therapeutic Outcomes (FOTO)  74                         [facility] Adult PT Treatment/Exercise - 04/13/20 1349      Lumbar Exercises:  Aerobic   Recumbent Bike L5 x 8 min      Lumbar Exercises: Machines for Strengthening   Leg Press 150# 3x10    Other Lumbar Machine Exercise Rows 35# 3x10    Other Lumbar Machine Exercise Lat pull downs 3x 10 35#      Lumbar Exercises: Standing   Functional Squats 20 reps    Functional Squats Limitations 12# KB    Lifting From floor;From waist;10 reps    Lifting Weights (lbs) 25    Side Lunge 20 reps   bil   Other Standing Lumbar Exercises suitcase carry 12# KB, 50' x 2 each side    Other Standing Lumbar Exercises SL deadlift 12# KB 2x10 bil                    PT Short Term Goals - 04/06/20 1311      PT SHORT TERM GOAL #1   Title Pt will be independent in his initial HEP.    Status Achieved  PT Long Term Goals - 04/06/20 1311      PT LONG TERM GOAL #1   Title Pt will improve his FOTO score from 47 to >/= 65.    Status On-going      PT LONG TERM GOAL #2   Title Pt will be independent in his HEP and progress to community based exercise program.    Status On-going      PT LONG TERM GOAL #3   Title Pt will improve his R LE strength to 5/5 for improved walking and funcitonal mobility.    Status On-going      PT LONG TERM GOAL #4   Title Pt will be able to lift 25# from floor to counter height using proper body mechanics and no pain reported.    Status On-going                 Plan - 04/13/20 1429    Clinical Impression Statement Pt with improved FOTO score to near predicted value, and overall improving functional mobility.  Will continue to benefit from PT to maximize function.    Examination-Activity Limitations Squat;Lift;Carry    Examination-Participation Restrictions Other;Community Activity    Stability/Clinical Decision Making Evolving/Moderate complexity    Rehab Potential Good    PT Frequency 2x / week    PT Duration 6 weeks    PT Treatment/Interventions Cryotherapy;Electrical Stimulation;Iontophoresis 4mg /ml Dexamethasone;Moist  Heat;Traction;Ultrasound;Gait training;Stair training;Functional mobility training;Therapeutic activities;Patient/family education;Neuromuscular re-education;Balance training;Manual techniques;Dry needling;Passive range of motion;Taping;Therapeutic exercise    PT Next Visit Plan Golf back mobilty, and bowling,  continue spinal mobs, strength and lifting kettlebell lifting progression as tolerated; may need recert/d/c depending on progress    PT Home Exercise Plan Access Code:  URL: [email or web address]  Date: 03/02/2020  Prepared by: 03/04/2020    Exercises  Supine Lower Trunk Rotation - 2 x daily - 7 x weekly - 5 reps - 20 seconds hold  Supine Bridge - 2 x daily - 7 x weekly - 2 sets - 10 reps - 5 seconds hold  Supine Piriformis Stretch with Foot on Ground - 2 x daily - 7 x weekly - 5 reps - 20 seconds hold  Cat-Camel - 2 x daily - 7 x weekly - 10 reps - 10 seconds hold    Consulted and Agree with Plan of Care Patient           Patient will benefit from skilled therapeutic intervention in order to improve the following deficits and impairments:  Pain,Impaired flexibility,Obesity,Difficulty walking,Decreased activity tolerance,Decreased range of motion,Decreased strength,Postural dysfunction,Decreased mobility  Visit Diagnosis: Chronic right-sided low back pain without sciatica  Difficulty in walking, not elsewhere classified  Muscle weakness (generalized)     Problem List Patient Active Problem List   Diagnosis Date Noted  . Right leg pain 11/25/2019  . Acute right-sided low back pain with right-sided sciatica 11/25/2019  . Sciatica of right side 11/25/2019  . Family history of heart disease 10/13/2017  . PVC's (premature ventricular contractions) 12/21/2012  . Hyperlipidemia with target LDL less than 100 10/08/2010  . Obesity (BMI 30-39.9) 10/08/2010      10/10/2010, PT, DPT 04/13/20 2:30 PM   [facility] Physical  Therapy [address] [place], [place], [place] Phone: [phone]   Fax:  [phone]  Name: Billy Tate MRN: [name] Date of Birth: 1965/01/21

## 2020-04-15 ENCOUNTER — Encounter: Payer: Commercial Managed Care - PPO | Admitting: Physical Therapy

## 2020-04-20 ENCOUNTER — Other Ambulatory Visit: Payer: Self-pay

## 2020-04-20 ENCOUNTER — Ambulatory Visit (INDEPENDENT_AMBULATORY_CARE_PROVIDER_SITE_OTHER): Payer: Commercial Managed Care - PPO | Admitting: Physical Therapy

## 2020-04-20 DIAGNOSIS — R262 Difficulty in walking, not elsewhere classified: Secondary | ICD-10-CM | POA: Diagnosis not present

## 2020-04-20 DIAGNOSIS — G8929 Other chronic pain: Secondary | ICD-10-CM | POA: Diagnosis not present

## 2020-04-20 DIAGNOSIS — M545 Low back pain, unspecified: Secondary | ICD-10-CM

## 2020-04-20 DIAGNOSIS — M6281 Muscle weakness (generalized): Secondary | ICD-10-CM | POA: Diagnosis not present

## 2020-04-20 NOTE — Therapy (Signed)
[facility] Physical Therapy [address] [place], [place], [address] Phone: [phone]   Fax:  [phone]  Physical Therapy Treatment  Patient Details  Name: Billy Tate MRN: [id number] Date of Birth: 1965-02-11 Referring Provider (PT): Gershon Mussel MD   Encounter Date: 04/20/2020   PT End of Session - 04/20/20 1554    Visit Number 12    Number of Visits 16    Date for PT Re-Evaluation 06/15/20    PT Start Time 1526    PT Stop Time 1600    PT Time Calculation (min) 34 min    Activity Tolerance Patient tolerated treatment well    Behavior During Therapy [facility] for tasks assessed/performed           Past Medical History:  Diagnosis Date  . Dyslipidemia   . Hemorrhoids     Past Surgical History:  Procedure Laterality Date  . APPENDECTOMY  1993    There were no vitals filed for this visit.   Subjective Assessment - 04/20/20 1547    Subjective He relays his back is overall doing well, stil with 3/10 pain overall but only localized to the center of his back. He relays good compliance with HEP. He is at visit 12/12 but feels he wants to come on wednesday as he already has this scheduled but then he feels like he wants to place PT on hold so he can return if he feels like he needs to as he has some concerns with being discharged today and wants to know he can come back to PT if he tweaks his back.    Pertinent History PVC's, hyperlipidemia    Limitations Sitting;Lifting;Walking;Standing    How long can you sit comfortably? it depends on where i'm sitting, harder chair about 30 minutes    How long can you stand comfortably? 45 mins    How long can you walk comfortably?    Patient Stated Goals Return to work without pain    Pain Onset More than a month ago              [facility] PT Assessment - 04/20/20 0001      Assessment   Medical Diagnosis m54.41 R sided LBP with sciatica    Referring Provider (PT) Gershon Mussel MD      Strength   Overall  Strength Comments tested in sitting    Right Hip Flexion 5/5    Right Hip ABduction 5/5    Left Hip Flexion 5/5    Left Hip ABduction 5/5    Left Hip ADduction 5/5    Right Knee Flexion 5/5    Right Knee Extension 5/5    Left Knee Flexion 5/5    Left Knee Extension 5/5                                   PT Short Term Goals - 04/06/20 1311      PT SHORT TERM GOAL #1   Title Pt will be independent in his initial HEP.    Status Achieved             PT Long Term Goals - 04/20/20 1607      PT LONG TERM GOAL #1   Title Pt will improve his FOTO score from 47 to >/= 65.    Baseline now 63    Status On-going      PT LONG TERM GOAL #2  Title Pt will be independent in his HEP and progress to community based exercise program.    Baseline will finialize next visit    Status On-going      PT LONG TERM GOAL #3   Title Pt will improve his R LE strength to 5/5 for improved walking and funcitonal mobility.    Status Achieved      PT LONG TERM GOAL #4   Title Pt will be able to lift 25# from floor to counter height using proper body mechanics and no pain reported.    Status Achieved                 Plan - 04/20/20 1556    Clinical Impression Statement We will recert his PT plan of care for 8 more weeks to allow him to return if he feels like he needs to, However as of now he plans to come on Wednesday then hold his PT to trial HEP and see how he does on his own as he wants to know that he can return in the event he regresses or aggravates his back. We will finialize HEP next visit.    Examination-Activity Limitations Squat;Lift;Carry    Examination-Participation Restrictions Other;Community Activity    Stability/Clinical Decision Making Evolving/Moderate complexity    Rehab Potential Good    PT Frequency 2x / week    PT Duration 6 weeks    PT Treatment/Interventions Cryotherapy;Electrical Stimulation;Iontophoresis 4mg /ml Dexamethasone;Moist  Heat;Traction;Ultrasound;Gait training;Stair training;Functional mobility training;Therapeutic activities;Patient/family education;Neuromuscular re-education;Balance training;Manual techniques;Dry needling;Passive range of motion;Taping;Therapeutic exercise    PT Next Visit Plan finialize HEP with more functional strength and core strength, then he wants to place PT on hold to trial HEP.    Consulted and Agree with Plan of Care Patient           Patient will benefit from skilled therapeutic intervention in order to improve the following deficits and impairments:  Pain,Impaired flexibility,Obesity,Difficulty walking,Decreased activity tolerance,Decreased range of motion,Decreased strength,Postural dysfunction,Decreased mobility  Visit Diagnosis: Chronic right-sided low back pain without sciatica  Difficulty in walking, not elsewhere classified  Muscle weakness (generalized)     Problem List Patient Active Problem List   Diagnosis Date Noted  . Right leg pain 11/25/2019  . Acute right-sided low back pain with right-sided sciatica 11/25/2019  . Sciatica of right side 11/25/2019  . Family history of heart disease 10/13/2017  . PVC's (premature ventricular contractions) 12/21/2012  . Hyperlipidemia with target LDL less than 100 10/08/2010  . Obesity (BMI 30-39.9) 10/08/2010    10/10/2010 04/20/2020, 4:12 PM  [facility] Physical Therapy [address] [place], [place], [place] Phone: [phone]   Fax:  [phone]  Name: Billy Tate MRN: [name] Date of Birth: 1965-07-10

## 2020-04-22 ENCOUNTER — Ambulatory Visit: Payer: Commercial Managed Care - PPO | Admitting: Physical Therapy

## 2020-04-22 ENCOUNTER — Other Ambulatory Visit: Payer: Self-pay

## 2020-04-22 ENCOUNTER — Encounter: Payer: Self-pay | Admitting: Physical Therapy

## 2020-04-22 DIAGNOSIS — M6281 Muscle weakness (generalized): Secondary | ICD-10-CM

## 2020-04-22 DIAGNOSIS — M545 Low back pain, unspecified: Secondary | ICD-10-CM

## 2020-04-22 DIAGNOSIS — R262 Difficulty in walking, not elsewhere classified: Secondary | ICD-10-CM

## 2020-04-22 DIAGNOSIS — G8929 Other chronic pain: Secondary | ICD-10-CM | POA: Diagnosis not present

## 2020-04-22 NOTE — Therapy (Addendum)
[facility] [place], [place], [address] Phone: [phone]   Fax:  [phone]  Physical Therapy Treatment/Discharge  Patient Details  Name: Billy Tate MRN: [id number] Date of Birth: 1965/07/27 Referring Provider (PT): Frankey Shown MD   Encounter Date: 04/22/2020   PT End of Session - 04/22/20 1504    Visit Number 13    Number of Visits 16    Date for PT Re-Evaluation 06/15/20    PT Start Time 1430    PT Stop Time 1508    PT Time Calculation (min) 38 min    Activity Tolerance Patient tolerated treatment well    Behavior During Therapy [facility] for tasks assessed/performed           Past Medical History:  Diagnosis Date  . Dyslipidemia   . Hemorrhoids     Past Surgical History:  Procedure Laterality Date  . APPENDECTOMY  1993    There were no vitals filed for this visit.   Subjective Assessment - 04/22/20 1429    Subjective doing well, no complaints    Pertinent History PVC's, hyperlipidemia    Limitations Sitting;Lifting;Walking;Standing    How long can you sit comfortably? it depends on where i'm sitting, harder chair about 30 minutes    How long can you stand comfortably? 45 mins    How long can you walk comfortably? 73mns    Patient Stated Goals Return to work without pain    Currently in Pain? Yes    Pain Score 3     Pain Location Back    Pain Orientation Left;Lower    Pain Descriptors / Indicators Aching;Sore    Pain Type Acute pain    Pain Onset More than a month ago    Pain Frequency Constant    Aggravating Factors  extension    Pain Relieving Factors aleve, rest, stretching              [facility] PT Assessment - 04/22/20 0001      Assessment   Medical Diagnosis m54.41 R sided LBP with sciatica    Referring Provider (PT) XFrankey ShownMD      Observation/Other Assessments   Focus on Therapeutic Outcomes (FOTO)  670                        [facility] Adult PT Treatment/Exercise - 04/22/20  1431      Lumbar Exercises: Stretches   Lower Trunk Rotation 3 reps;30 seconds    Piriformis Stretch Right;Left;3 reps;30 seconds      Lumbar Exercises: Aerobic   Recumbent Bike L5 x 8 min      Lumbar Exercises: Supine   Bridge 10 reps    Other Supine Lumbar Exercises crunch 3x10; oblique crunch 3x10      Lumbar Exercises: Quadruped   Madcat/Old Horse 10 reps    Plank high plank 3x30 sec    Other Quadruped Lumbar Exercises childs pose 2-3x15 sec                  PT Education - 04/22/20 1504    Education Details HEP    Person(s) Educated Patient    Methods Explanation;Demonstration;Handout    Comprehension Verbalized understanding;Returned demonstration            PT Short Term Goals - 04/06/20 1311      PT SHORT TERM GOAL #1   Title Pt will be independent in his initial HEP.    Status Achieved  PT Long Term Goals - 04/22/20 1505      PT LONG TERM GOAL #1   Title Pt will improve his FOTO score from 47 to >/= 65.    Baseline now 63    Status Achieved      PT LONG TERM GOAL #2   Title Pt will be independent in his HEP and progress to community based exercise program.    Baseline will finialize next visit    Status Achieved      PT LONG TERM GOAL #3   Title Pt will improve his R LE strength to 5/5 for improved walking and funcitonal mobility.    Status Achieved      PT LONG TERM GOAL #4   Title Pt will be able to lift 25# from floor to counter height using proper body mechanics and no pain reported.    Status Achieved                 Plan - 04/22/20 1512    Clinical Impression Statement Pt tolerated session well today and has met all goals at this time.  Anticipate d/c but will hold in case pain returns within the next month.    Examination-Activity Limitations Squat;Lift;Carry    Examination-Participation Restrictions Other;Community Activity    Stability/Clinical Decision Making Evolving/Moderate complexity    Rehab Potential  Good    PT Frequency 2x / week    PT Duration 6 weeks    PT Treatment/Interventions Cryotherapy;Electrical Stimulation;Iontophoresis 29m/ml Dexamethasone;Moist Heat;Traction;Ultrasound;Gait training;Stair training;Functional mobility training;Therapeutic activities;Patient/family education;Neuromuscular re-education;Balance training;Manual techniques;Dry needling;Passive range of motion;Taping;Therapeutic exercise    PT Next Visit Plan hold PT, d/c if pt does not return    Consulted and Agree with Plan of Care Patient           Patient will benefit from skilled therapeutic intervention in order to improve the following deficits and impairments:  Pain,Impaired flexibility,Obesity,Difficulty walking,Decreased activity tolerance,Decreased range of motion,Decreased strength,Postural dysfunction,Decreased mobility  Visit Diagnosis: Chronic right-sided low back pain without sciatica  Difficulty in walking, not elsewhere classified  Muscle weakness (generalized)     Problem List Patient Active Problem List   Diagnosis Date Noted  . Right leg pain 11/25/2019  . Acute right-sided low back pain with right-sided sciatica 11/25/2019  . Sciatica of right side 11/25/2019  . Family history of heart disease 10/13/2017  . PVC's (premature ventricular contractions) 12/21/2012  . Hyperlipidemia with target LDL less than 100 10/08/2010  . Obesity (BMI 30-39.9) 10/08/2010      SLaureen Abrahams PT, DPT 04/22/20 3:13 PM   PHYSICAL THERAPY DISCHARGE SUMMARY  Visits from Start of Care: 13  Current functional level related to goals / functional outcomes: See note   Remaining deficits: See note   Education / Equipment: HEP Plan: Patient agrees to discharge.  Patient goals were partially met. Patient is being discharged due to not returning since the last visit.  ?????     MScot Jun PT, DPT, OCS, ATC 06/16/20  1:56 PM      [facility] [address] [address]Phone: [phone]  Fax:  [phone] Name: Billy LehMRN: [id number] of Birth: [birth year]

## 2020-04-22 NOTE — Patient Instructions (Signed)
Access Code: [email or web address] URL: [email or web address] Date: 04/22/2020 Prepared by: Moshe Cipro  Exercises Supine Lower Trunk Rotation - 2 x daily - 7 x weekly - 5 reps - 20 seconds hold Supine Bridge - 2 x daily - 7 x weekly - 2 sets - 10 reps - 5 seconds hold Supine Piriformis Stretch with Foot on Ground - 2 x daily - 7 x weekly - 5 reps - 20 seconds hold Cat-Camel - 2 x daily - 7 x weekly - 10 reps - 10 seconds hold Full Plank - 1 x daily - 7 x weekly - 1 sets - 3 reps - 30 sec hold Curl Up with Arms Crossed - 1 x daily - 7 x weekly - 3 sets - 10 reps Diagonal Curl Up with Reach - 1 x daily - 7 x weekly - 3 sets - 10 reps Squat - 1 x daily - 7 x weekly - 3 sets - 10 reps Half Deadlift with Kettlebell - 1 x daily - 7 x weekly - 3 sets - 10 reps

## 2020-05-11 ENCOUNTER — Encounter: Payer: Self-pay | Admitting: Orthopaedic Surgery

## 2020-05-12 NOTE — Telephone Encounter (Signed)
Yes that's fine 

## 2020-08-31 ENCOUNTER — Telehealth (INDEPENDENT_AMBULATORY_CARE_PROVIDER_SITE_OTHER): Payer: BLUE CROSS/BLUE SHIELD | Admitting: Family Medicine

## 2020-08-31 ENCOUNTER — Other Ambulatory Visit: Payer: Self-pay

## 2020-08-31 ENCOUNTER — Encounter: Payer: Self-pay | Admitting: Family Medicine

## 2020-08-31 VITALS — Temp 97.4°F | Ht 71.0 in | Wt 225.0 lb

## 2020-08-31 DIAGNOSIS — Z20822 Contact with and (suspected) exposure to covid-19: Secondary | ICD-10-CM

## 2020-08-31 NOTE — Progress Notes (Signed)
   Subjective:    Patient ID: Billy Tate, male    DOB: 1965-02-05, 55 y.o.   MRN: [id number]  HPI Documentation for virtual audio and video telecommunications through Caregility encounter: The patient was located at home. 2 patient identifiers used.  The provider was located in the office. The patient did consent to this visit and is aware of possible charges through their insurance for this visit. The other persons participating in this telemedicine service were none. Time spent on call was 5 minutes and in review of previous records >15 minutes total for counseling and coordination of care. This virtual service is not related to other E/M service within previous 7 days.  He states that on Thursday he developed a slight scratchy throat as well as some chest congestion and coughing.  His wife tested positive for COVID that day.  Since then he has had some chills and headache but feels better than earlier in the week.  He did test himself with a test was faulty giving no results at all.   Review of Systems     Objective:   Physical Exam Alert and in no distress with normal speech and breathing pattern.       Assessment & Plan:  Exposure to COVID-19 virus I explained that at this point he is 4 days into a 5-day sequestration sequence and feeling better.  Recommend he stay sequestered for another day and recommend that he go get a test that is more accurate for better documentation.  Explained that after Tuesday, he can become more active as well as wearing a mask for another 5 days.  Did recommend to avoid going to meetings etc. for at least another 5 days after the initial 5-day timeframe.  He expressed understanding of that.

## 2020-12-29 ENCOUNTER — Encounter: Payer: Self-pay | Admitting: Family Medicine

## 2021-08-20 NOTE — Progress Notes (Unsigned)
Complete physical exam  Patient: Billy Tate   DOB: 1965-06-06   56 y.o. Male  MRN: [id number]  Subjective:    No chief complaint on file.   Beverley Sherrard is a 56 y.o. male who presents today for a complete physical exam. He reports consuming a {diet types:17450} diet. {types:19826} He generally feels {DESC; WELL/FAIRLY WELL/POORLY:18703}. He reports sleeping {DESC; WELL/FAIRLY WELL/POORLY:18703}. He {does/does not:200015} have additional problems to discuss today.    Most recent fall risk assessment:    10/13/2017    2:42 PM  Fall Risk   Falls in the past year? No     Most recent depression screenings:    12/10/2019    3:21 PM 10/13/2017    2:42 PM  PHQ 2/9 Scores  PHQ - 2 Score 0 0    {VISON DENTAL STD PSA (Optional):27386}  Patient Active Problem List   Diagnosis Date Noted   Right leg pain 11/25/2019   Acute right-sided low back pain with right-sided sciatica 11/25/2019   Sciatica of right side 11/25/2019   Family history of heart disease 10/13/2017   PVC's (premature ventricular contractions) 12/21/2012   Hyperlipidemia with target LDL less than 100 10/08/2010   Obesity (BMI 30-39.9) 10/08/2010   Past Medical History:  Diagnosis Date   Dyslipidemia    Hemorrhoids    Past Surgical History:  Procedure Laterality Date   APPENDECTOMY  1993   Social History   Tobacco Use   Smoking status: Never   Smokeless tobacco: Never  Substance Use Topics   Alcohol use: No   Drug use: No   No family history on file. No Known Allergies    Patient Care Team: Ronnald Nian, MD as PCP - General (Family Medicine)   Outpatient Medications Prior to Visit  Medication Sig   acetaminophen (TYLENOL) 325 MG tablet Take 650 mg by mouth every 6 (six) hours as needed for mild pain.   docusate sodium (COLACE) 100 MG capsule Take 1 capsule (100 mg total) by mouth 2 (two) times daily. (Patient not taking: No sig reported)   HYDROcodone-acetaminophen (NORCO) 5-325 MG tablet Take  1 tablet by mouth every 6 (six) hours as needed for moderate pain. (Patient not taking: No sig reported)   hydrocortisone 2.5 % cream Apply topically 2 (two) times daily. (Patient not taking: No sig reported)   meclizine (ANTIVERT) 25 MG tablet Take 1 tablet (25 mg total) by mouth 3 (three) times daily as needed for dizziness. (Patient not taking: No sig reported)   methocarbamol (ROBAXIN) 500 MG tablet Take 1 tablet (500 mg total) by mouth 2 (two) times daily as needed. (Patient not taking: No sig reported)   Multiple Vitamins-Minerals (MULTIVITAMIN WITH MINERALS) tablet Take 1 tablet by mouth daily.   naproxen sodium (ANAPROX) 220 MG tablet Take 220 mg by mouth 2 (two) times daily as needed (for pain).   predniSONE (STERAPRED UNI-PAK 21 TAB) 10 MG (21) TBPK tablet Take as directed (Patient not taking: No sig reported)   scopolamine (TRANSDERM-SCOP, 1.5 MG,) 1 MG/3DAYS Place 1 patch (1.5 mg total) onto the skin every 3 (three) days. (Patient not taking: No sig reported)   No facility-administered medications prior to visit.    ROS        Objective:     There were no vitals taken for this visit. BP Readings from Last 3 Encounters:  02/13/20 (!) 152/101  12/19/19 (!) 146/100  12/10/19 (!) 142/96   Wt Readings from Last 3 Encounters:  08/31/20 225 lb (102.1 kg)  01/01/20 232 lb 9.6 oz (105.5 kg)  12/19/19 233 lb (105.7 kg)      Physical Exam   No results found for any visits on 08/23/21. Last CBC Lab Results  Component Value Date   WBC 3.5 10/13/2017   HGB 16.3 10/13/2017   HCT 47.6 10/13/2017   MCV 90 10/13/2017   MCH 30.7 10/13/2017   RDW 14.0 10/13/2017   PLT 196 10/13/2017   Last metabolic panel Lab Results  Component Value Date   GLUCOSE 87 10/13/2017   NA 142 10/13/2017   K 3.8 10/13/2017   CL 102 10/13/2017   CO2 23 10/13/2017   BUN 18 10/13/2017   CREATININE 1.12 10/13/2017   GFRNONAA 75 10/13/2017   CALCIUM 9.4 10/13/2017   PROT 7.2 10/13/2017    ALBUMIN 4.2 10/13/2017   LABGLOB 3.0 10/13/2017   AGRATIO 1.4 10/13/2017   BILITOT 0.6 10/13/2017   ALKPHOS 74 10/13/2017   AST 23 10/13/2017   ALT 29 10/13/2017   ANIONGAP 10 06/10/2016   Last lipids Lab Results  Component Value Date   CHOL 230 (H) 10/13/2017   HDL 69 10/13/2017   LDLCALC 149 (H) 10/13/2017   TRIG 61 10/13/2017   CHOLHDL 3.3 10/13/2017        Assessment & Plan:    Routine Health Maintenance and Physical Exam  Immunization History  Administered Date(s) Administered   Moderna Sars-Covid-2 Vaccination 04/27/2019, 05/25/2019   Tdap 12/21/2012    Health Maintenance  Topic Date Due   HIV Screening  Never done   Hepatitis C Screening  Never done   COLONOSCOPY (Pts 45-27yrs Insurance coverage will need to be confirmed)  Never done   Zoster Vaccines- Shingrix (1 of 2) Never done   COVID-19 Vaccine (3 - Moderna series) 07/20/2019   INFLUENZA VACCINE  08/10/2021   TETANUS/TDAP  12/22/2022   HPV VACCINES  Aged Out    Discussed health benefits of physical activity, and encouraged him to engage in regular exercise appropriate for his age and condition.  Problem List Items Addressed This Visit   None  No follow-ups on file.     Renelda Loma, RMA

## 2021-08-23 ENCOUNTER — Ambulatory Visit (INDEPENDENT_AMBULATORY_CARE_PROVIDER_SITE_OTHER): Payer: BLUE CROSS/BLUE SHIELD | Admitting: Family Medicine

## 2021-08-23 ENCOUNTER — Encounter: Payer: Self-pay | Admitting: Family Medicine

## 2021-08-23 VITALS — BP 128/80 | HR 64 | Temp 98.5°F | Ht 71.0 in | Wt 232.4 lb

## 2021-08-23 DIAGNOSIS — E669 Obesity, unspecified: Secondary | ICD-10-CM | POA: Diagnosis not present

## 2021-08-23 DIAGNOSIS — Z8249 Family history of ischemic heart disease and other diseases of the circulatory system: Secondary | ICD-10-CM

## 2021-08-23 DIAGNOSIS — Z0000 Encounter for general adult medical examination without abnormal findings: Secondary | ICD-10-CM

## 2021-08-23 DIAGNOSIS — E785 Hyperlipidemia, unspecified: Secondary | ICD-10-CM

## 2021-08-23 DIAGNOSIS — I493 Ventricular premature depolarization: Secondary | ICD-10-CM | POA: Diagnosis not present

## 2021-08-23 DIAGNOSIS — Z1159 Encounter for screening for other viral diseases: Secondary | ICD-10-CM

## 2021-08-23 DIAGNOSIS — Z1211 Encounter for screening for malignant neoplasm of colon: Secondary | ICD-10-CM

## 2021-08-23 NOTE — Progress Notes (Signed)
Complete physical exam  Patient: Billy Tate   DOB: Nov 02, 1965   56 y.o. Male  MRN: [id number]  Subjective:    Chief Complaint  Patient presents with   Annual Exam    Cpe back injury last year at the end of 2021 has not been exercising as much.     Billy Tate is a 56 y.o. male who presents today for a complete physical exam. He reports consuming a general diet. Home exercise routine includes calisthenics. He generally feels well. He reports sleeping fairly well. He does not have additional problems to discuss today.  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  He keeps himself active but is not involved in a regular exercise program.  He has been doing some physical therapy types of exercises from a previous injury to his back and finds this quite helpful.  He does have a previous history of PVCs as well as hyperlipidemia.  The heart disease history is really more heart failure.  Presently he is on no medications.  His home life is going quite well.  He has 5 grandchildren that he enjoys spending time with.  Family and social history as well as health maintenance and immunizations was reviewed.   Most recent fall risk assessment:    08/23/2021    9:59 AM  Fall Risk   Falls in the past year? 0  Number falls in past yr: 0  Injury with Fall? 0  Risk for fall due to : No Fall Risks  Follow up Falls evaluation completed     Most recent depression screenings:    08/23/2021   10:00 AM 12/10/2019    3:21 PM  PHQ 2/9 Scores  PHQ - 2 Score 0 0        Patient Care Team: Ronnald Nian, MD as PCP - General (Family Medicine)   Outpatient Medications Prior to Visit  Medication Sig   acetaminophen (TYLENOL) 325 MG tablet Take 650 mg by mouth every 6 (six) hours as needed for mild pain.   Multiple Vitamins-Minerals (MULTIVITAMIN WITH MINERALS) tablet Take 1 tablet by mouth daily.   naproxen sodium (ANAPROX) 220 MG tablet Take 220 mg by mouth 2 (two) times  daily as needed (for pain).   docusate sodium (COLACE) 100 MG capsule Take 1 capsule (100 mg total) by mouth 2 (two) times daily. (Patient not taking: Reported on 03/02/2020)   HYDROcodone-acetaminophen (NORCO) 5-325 MG tablet Take 1 tablet by mouth every 6 (six) hours as needed for moderate pain. (Patient not taking: Reported on 03/02/2020)   hydrocortisone 2.5 % cream Apply topically 2 (two) times daily. (Patient not taking: Reported on 03/02/2020)   meclizine (ANTIVERT) 25 MG tablet Take 1 tablet (25 mg total) by mouth 3 (three) times daily as needed for dizziness. (Patient not taking: Reported on 03/02/2020)   methocarbamol (ROBAXIN) 500 MG tablet Take 1 tablet (500 mg total) by mouth 2 (two) times daily as needed. (Patient not taking: Reported on 03/02/2020)   predniSONE (STERAPRED UNI-PAK 21 TAB) 10 MG (21) TBPK tablet Take as directed (Patient not taking: Reported on 03/02/2020)   scopolamine (TRANSDERM-SCOP, 1.5 MG,) 1 MG/3DAYS Place 1 patch (1.5 mg total) onto the skin every 3 (three) days. (Patient not taking: Reported on 03/02/2020)   No facility-administered medications prior to visit.    Review of Systems  All other systems reviewed and are negative.         Objective:  BP 128/80   Pulse 64   Temp 98.5 F (36.9 C)   Ht 5\' 11"  (1.803 m)   Wt 232 lb 6.4 oz (105.4 kg)   BMI 32.41 kg/m    Physical Exam  Alert and in no distress. Tympanic membranes and canals are normal. Pharyngeal area is normal. Neck is supple without adenopathy or thyromegaly. Cardiac exam shows a regular sinus rhythm without murmurs or gallops. Lungs are clear to auscultation.  Abdominal exam shows no masses or tenderness with normal bowel sounds     Assessment & Plan:    Routine general medical examination at a health care facility - Plan: CBC with Differential/Platelet, Comprehensive metabolic panel, Lipid panel  Family history of heart disease  Obesity (BMI 30-39.9)  PVC's (premature  ventricular contractions)  Hyperlipidemia with target LDL less than 100 - Plan: Lipid panel  Screening for colon cancer - Plan: Cologuard  Need for hepatitis C screening test - Plan: Hepatitis C antibody  Immunization History  Administered Date(s) Administered   Moderna Sars-Covid-2 Vaccination 04/27/2019, 05/25/2019   Tdap 12/21/2012    Health Maintenance  Topic Date Due   HIV Screening  Never done   Hepatitis C Screening  Never done   COLONOSCOPY (Pts 45-34yrs Insurance coverage will need to be confirmed)  Never done   Zoster Vaccines- Shingrix (1 of 2) Never done   COVID-19 Vaccine (3 - Moderna series) 07/20/2019   INFLUENZA VACCINE  08/10/2021   TETANUS/TDAP  12/22/2022   HPV VACCINES  Aged Out    Discussed health benefits of physical activity, and encouraged him to engage in regular exercise appropriate for his age and condition.  Also discussed cutting back on carbohydrates to help with his weight loss.        14/12/2022, MD

## 2021-08-24 LAB — COMPREHENSIVE METABOLIC PANEL
ALT: 14 IU/L (ref 0–44)
AST: 18 IU/L (ref 0–40)
Albumin/Globulin Ratio: 1.4 (ref 1.2–2.2)
Albumin: 4.1 g/dL (ref 3.8–4.9)
Alkaline Phosphatase: 75 IU/L (ref 44–121)
BUN/Creatinine Ratio: 9 (ref 9–20)
BUN: 12 mg/dL (ref 6–24)
Bilirubin Total: 0.5 mg/dL (ref 0.0–1.2)
CO2: 19 mmol/L — ABNORMAL LOW (ref 20–29)
Calcium: 9.1 mg/dL (ref 8.7–10.2)
Chloride: 107 mmol/L — ABNORMAL HIGH (ref 96–106)
Creatinine, Ser: 1.28 mg/dL — ABNORMAL HIGH (ref 0.76–1.27)
Globulin, Total: 2.9 g/dL (ref 1.5–4.5)
Glucose: 101 mg/dL — ABNORMAL HIGH (ref 70–99)
Potassium: 4 mmol/L (ref 3.5–5.2)
Sodium: 141 mmol/L (ref 134–144)
Total Protein: 7 g/dL (ref 6.0–8.5)
eGFR: 66 mL/min/{1.73_m2} (ref >59)

## 2021-08-24 LAB — CBC WITH DIFFERENTIAL/PLATELET
Basophils Absolute: 0 10*3/uL (ref 0.0–0.2)
Basos: 1 %
EOS (ABSOLUTE): 0.3 10*3/uL (ref 0.0–0.4)
Eos: 7 %
Hematocrit: 48.1 % (ref 37.5–51.0)
Hemoglobin: 16.5 g/dL (ref 13.0–17.7)
Immature Grans (Abs): 0 10*3/uL (ref 0.0–0.1)
Immature Granulocytes: 0 %
Lymphocytes Absolute: 1.5 10*3/uL (ref 0.7–3.1)
Lymphs: 40 %
MCH: 30.8 pg (ref 26.6–33.0)
MCHC: 34.3 g/dL (ref 31.5–35.7)
MCV: 90 fL (ref 79–97)
Monocytes Absolute: 0.4 10*3/uL (ref 0.1–0.9)
Monocytes: 10 %
Neutrophils Absolute: 1.6 10*3/uL (ref 1.4–7.0)
Neutrophils: 42 %
Platelets: 163 10*3/uL (ref 150–450)
RBC: 5.36 x10E6/uL (ref 4.14–5.80)
RDW: 14.3 % (ref 11.6–15.4)
WBC: 3.8 10*3/uL (ref 3.4–10.8)

## 2021-08-24 LAB — LIPID PANEL
Chol/HDL Ratio: 3.8 ratio (ref 0.0–5.0)
Cholesterol, Total: 238 mg/dL — ABNORMAL HIGH (ref 100–199)
HDL: 62 mg/dL (ref >39)
LDL Chol Calc (NIH): 164 mg/dL — ABNORMAL HIGH (ref 0–99)
Triglycerides: 69 mg/dL (ref 0–149)
VLDL Cholesterol Cal: 12 mg/dL (ref 5–40)

## 2021-08-24 LAB — HEPATITIS C ANTIBODY: Hep C Virus Ab: NONREACTIVE

## 2021-09-15 ENCOUNTER — Encounter: Payer: Self-pay | Admitting: Internal Medicine

## 2021-10-19 ENCOUNTER — Encounter: Payer: Self-pay | Admitting: Internal Medicine

## 2021-11-01 ENCOUNTER — Encounter: Payer: Self-pay | Admitting: Internal Medicine

## 2021-11-26 ENCOUNTER — Ambulatory Visit: Payer: BLUE CROSS/BLUE SHIELD | Admitting: Medical

## 2022-08-15 IMAGING — CR DG LUMBAR SPINE COMPLETE 4+V
5 series · 5 of 5 positions shown · non-contrast
Comparison: None.

CLINICAL DATA: Right-sided low back pain radiating into the right
hip and down the right leg for the past 2 weeks.

EXAM:
LUMBAR SPINE - COMPLETE 4+ VIEW

[w lumbar spine ap]
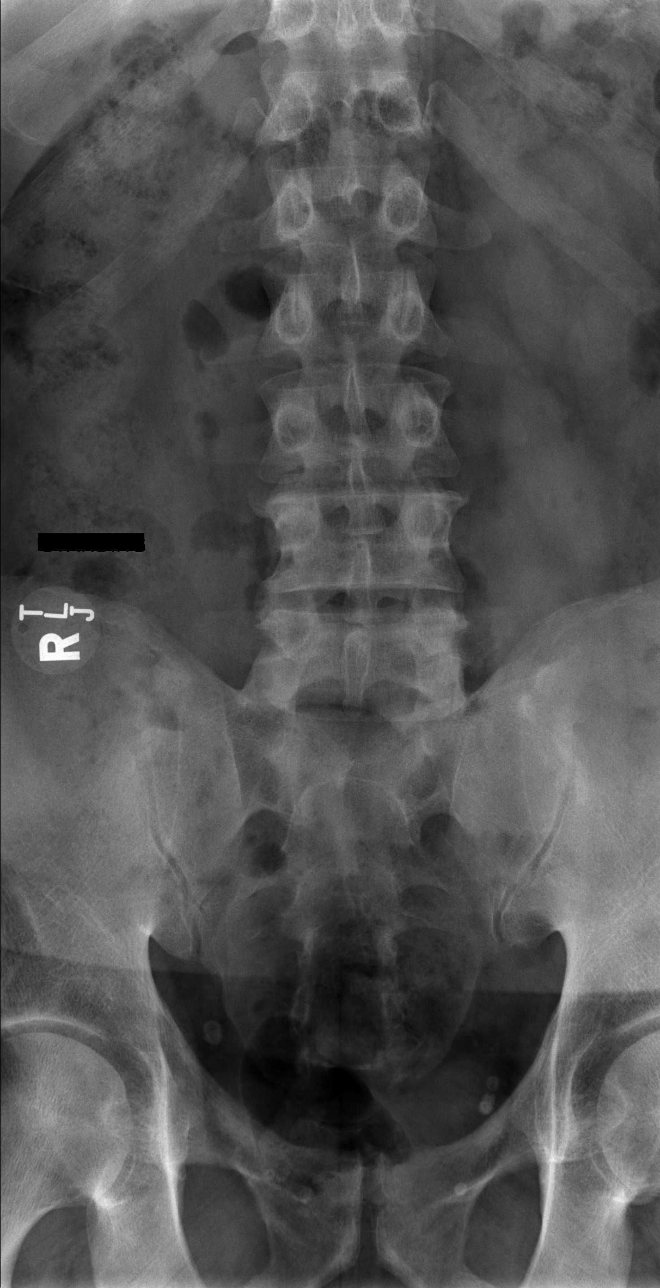

[w lumbar spine obl (1 of 2)]
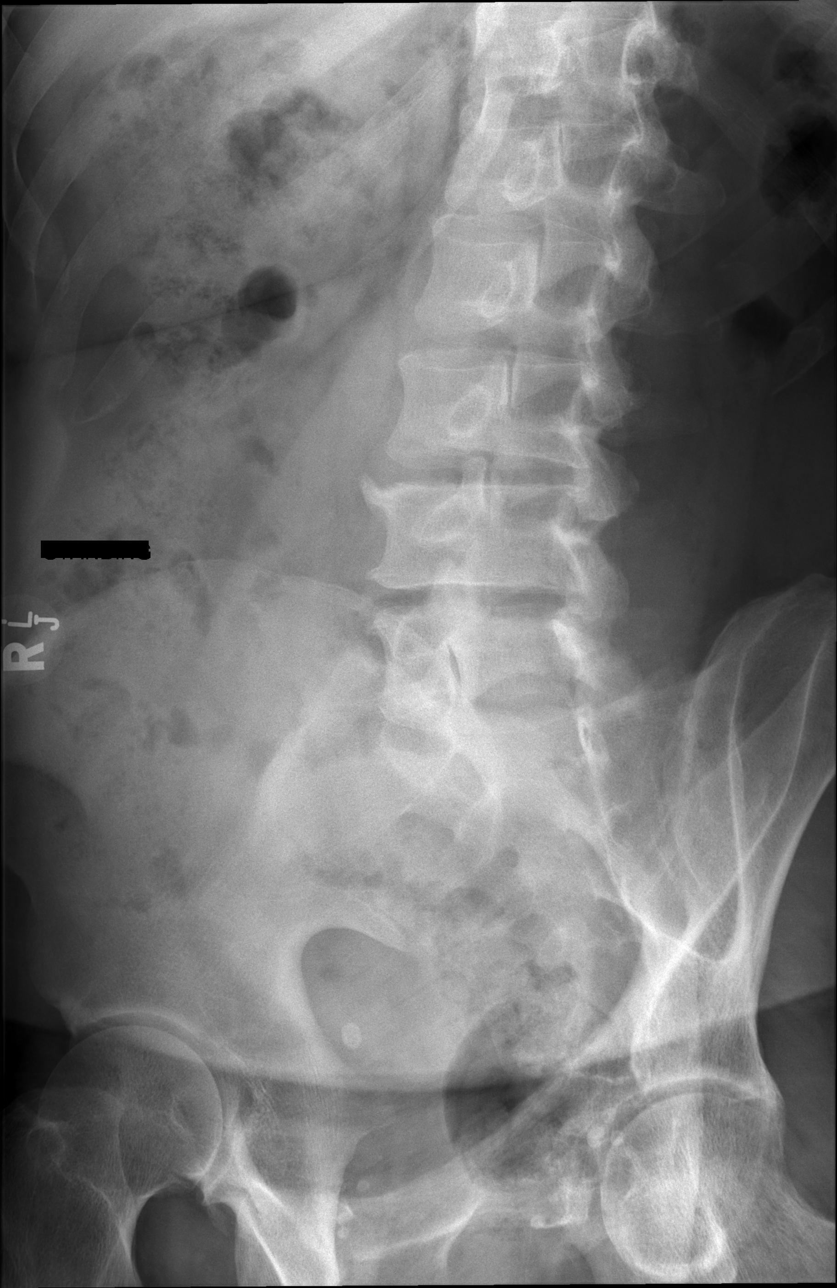

[w lumbar spine obl (2 of 2)]
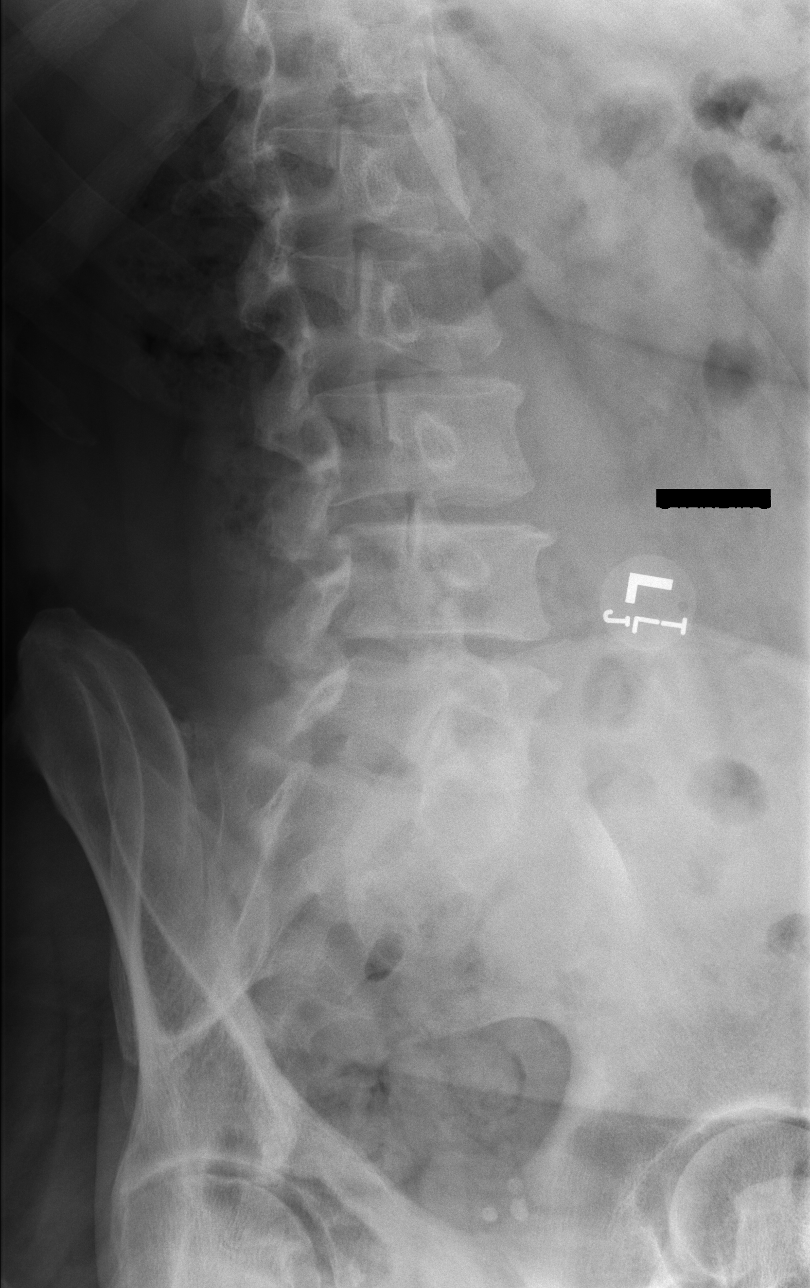

[w lumbar spine lat]
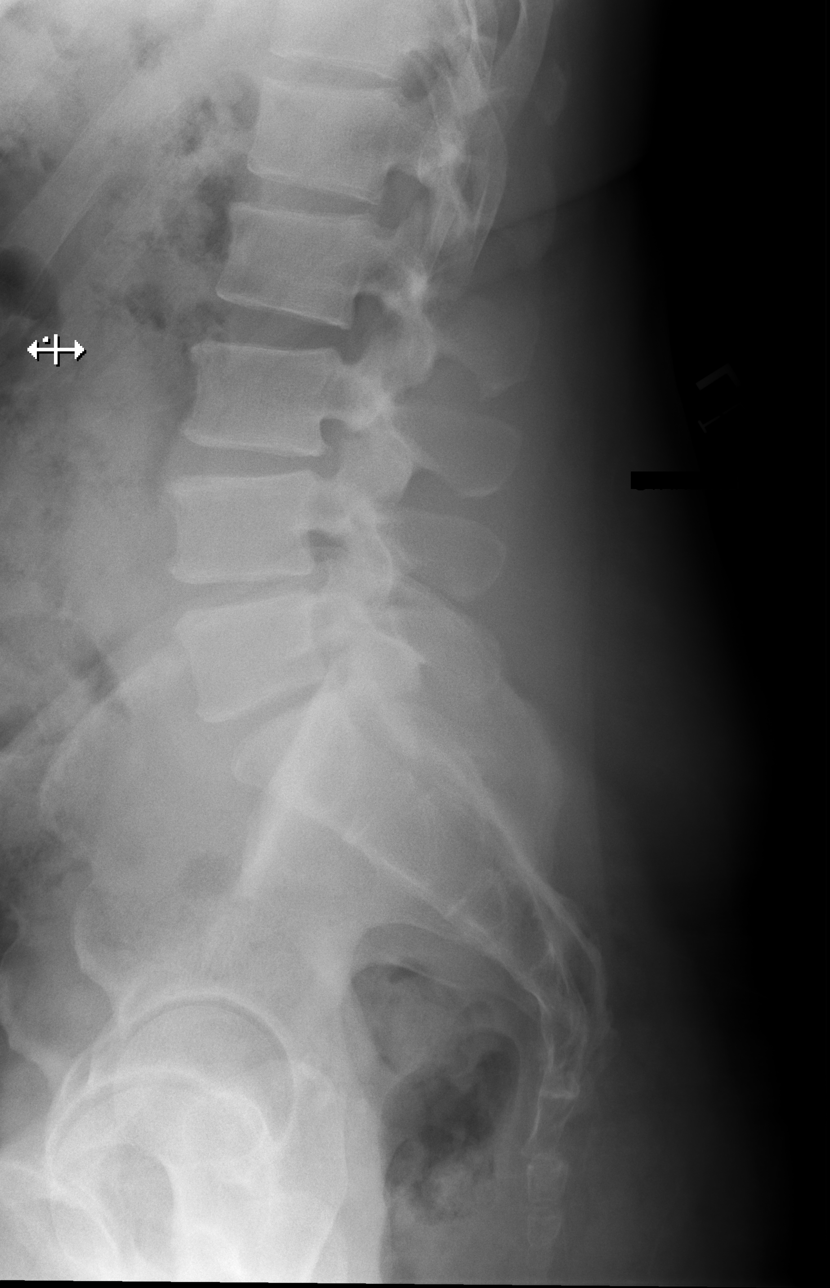

[w lumbar l-5 s-1 spot]
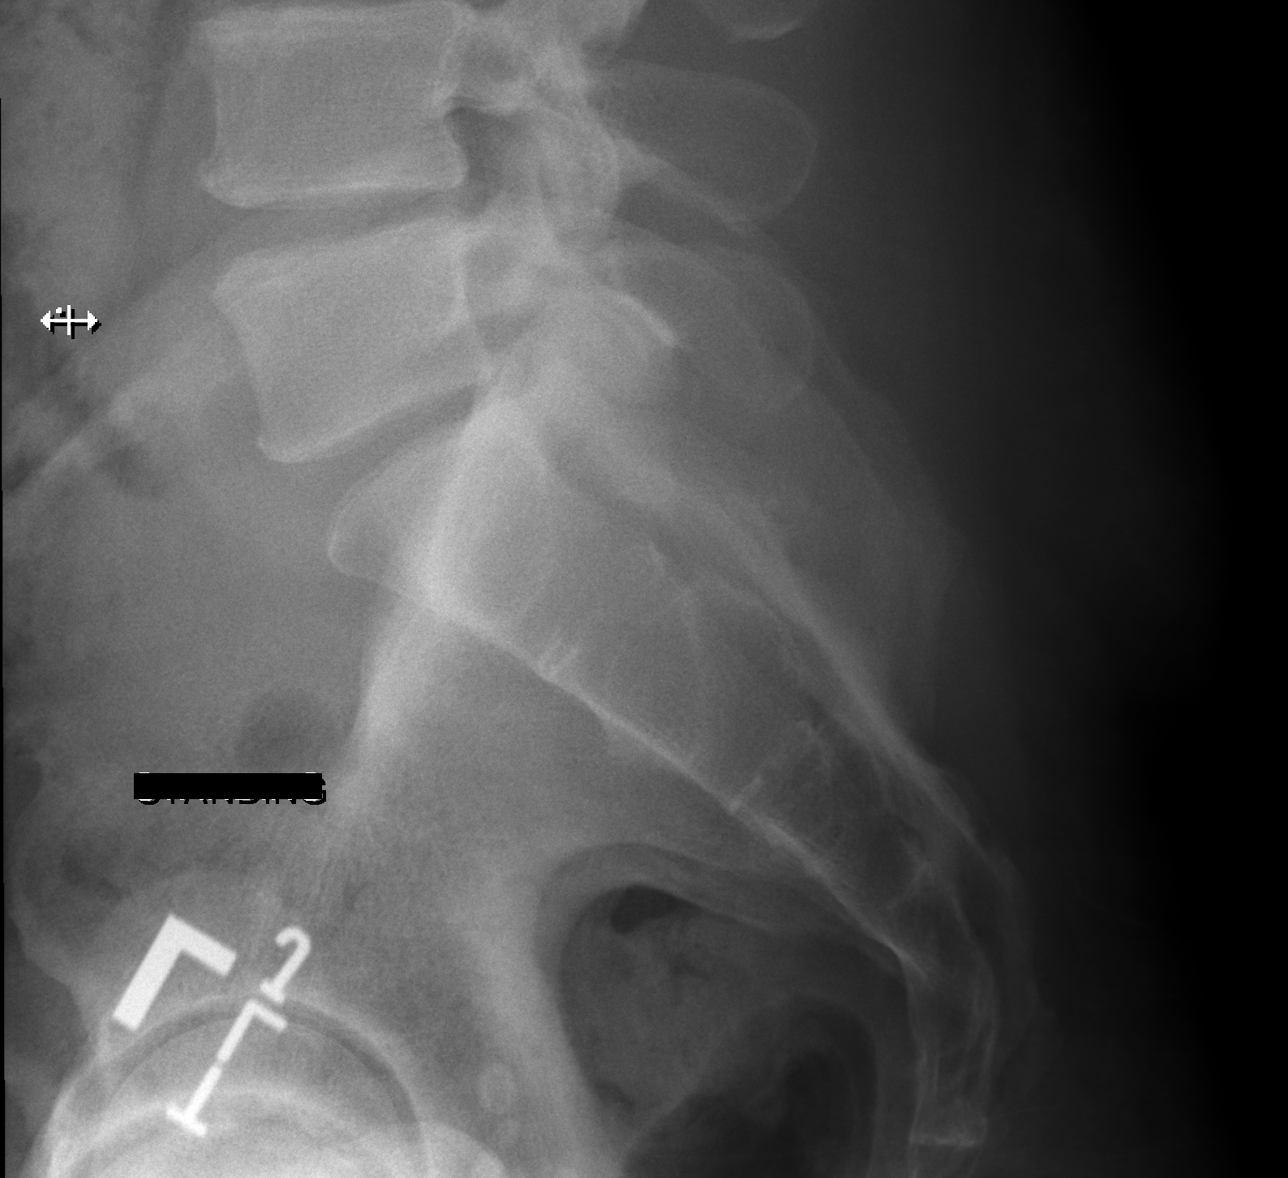

[5 of 5 positions shown; findings below may reference images not displayed]

FINDINGS: Five lumbar type vertebral bodies. No acute fracture or subluxation.
Vertebral body heights are preserved.

Alignment is normal. Mild disc height loss at L3-L4, L4-L5, and
L5-S1.

The sacroiliac joints are unremarkable.
IMPRESSION: 1. Mild lower lumbar degenerative disc disease.

## 2022-08-26 ENCOUNTER — Encounter: Payer: BLUE CROSS/BLUE SHIELD | Admitting: Family Medicine

## 2022-08-31 ENCOUNTER — Encounter: Payer: BLUE CROSS/BLUE SHIELD | Admitting: Family Medicine

## 2022-08-31 DIAGNOSIS — Z1211 Encounter for screening for malignant neoplasm of colon: Secondary | ICD-10-CM

## 2022-08-31 DIAGNOSIS — Z0000 Encounter for general adult medical examination without abnormal findings: Secondary | ICD-10-CM

## 2022-08-31 DIAGNOSIS — E669 Obesity, unspecified: Secondary | ICD-10-CM

## 2022-08-31 DIAGNOSIS — E785 Hyperlipidemia, unspecified: Secondary | ICD-10-CM

## 2022-08-31 DIAGNOSIS — Z23 Encounter for immunization: Secondary | ICD-10-CM

## 2022-08-31 DIAGNOSIS — I493 Ventricular premature depolarization: Secondary | ICD-10-CM

## 2022-08-31 DIAGNOSIS — Z8249 Family history of ischemic heart disease and other diseases of the circulatory system: Secondary | ICD-10-CM

## 2022-10-14 ENCOUNTER — Other Ambulatory Visit: Payer: Self-pay | Admitting: Family Medicine

## 2022-10-14 DIAGNOSIS — Z1211 Encounter for screening for malignant neoplasm of colon: Secondary | ICD-10-CM

## 2022-10-14 DIAGNOSIS — Z1212 Encounter for screening for malignant neoplasm of rectum: Secondary | ICD-10-CM

## 2022-11-30 ENCOUNTER — Encounter: Payer: BLUE CROSS/BLUE SHIELD | Admitting: Family Medicine

## 2023-03-02 ENCOUNTER — Encounter: Payer: BLUE CROSS/BLUE SHIELD | Admitting: Family Medicine

## 2023-03-07 ENCOUNTER — Encounter: Payer: Self-pay | Admitting: Internal Medicine
